# Patient Record
Sex: Male | Born: 1950 | Race: White | Hispanic: No | Marital: Married | Smoking: Light tobacco smoker
Health system: Southern US, Community
[De-identification: ages and names within clinical notes are randomized; demographics above are authoritative.]

## PROBLEM LIST (undated history)

## (undated) DIAGNOSIS — F419 Anxiety disorder, unspecified: Secondary | ICD-10-CM

## (undated) DIAGNOSIS — I10 Essential (primary) hypertension: Secondary | ICD-10-CM

## (undated) HISTORY — PX: CATARACT EXTRACTION: SUR2

---

## 2005-05-18 ENCOUNTER — Ambulatory Visit (HOSPITAL_COMMUNITY): Admission: RE | Admit: 2005-05-18 | Discharge: 2005-05-18 | Payer: Self-pay | Admitting: Pulmonary Disease

## 2009-08-03 ENCOUNTER — Ambulatory Visit: Admission: RE | Admit: 2009-08-03 | Discharge: 2009-08-03 | Payer: Self-pay | Admitting: Pulmonary Disease

## 2013-09-16 NOTE — H&P (Signed)
Christopher Lowery is an 63 y.o. male.   Chief Complaint: Need for screening colonoscopy HPI: 64 year old white male who presents for a screening colonoscopy. His last colonoscopy was over 10 years ago.  No past medical history on file.  No past surgical history on file.  No family history on file. Social History:  has no tobacco, alcohol, and drug history on file.  Allergies: Allergies not on file  No prescriptions prior to admission    No results found for this or any previous visit (from the past 48 hour(s)). No results found.  Review of Systems  All other systems reviewed and are negative.   There were no vitals taken for this visit. Physical Exam  Constitutional: He is oriented to person, place, and time. He appears well-developed and well-nourished.  HENT:  Head: Normocephalic and atraumatic.  Neck: Normal range of motion. Neck supple.  Cardiovascular: Normal rate, regular rhythm and normal heart sounds.   Respiratory: Effort normal and breath sounds normal.  GI: Soft. Bowel sounds are normal.  Neurological: He is alert and oriented to person, place, and time.  Skin: Skin is warm and dry.     Assessment/Plan Impression: Need for screening colonoscopy Plan: Patient will undergo a colonoscopy on 09/18/2013. Risks and benefits of the procedure including bleeding and perforation were fully explained to the patient, who gave informed consent.  Bentli Llorente A 09/16/2013, 11:17 AM

## 2013-09-17 ENCOUNTER — Encounter (HOSPITAL_COMMUNITY): Payer: Self-pay | Admitting: Pharmacy Technician

## 2013-09-18 ENCOUNTER — Ambulatory Visit (HOSPITAL_COMMUNITY)
Admission: RE | Admit: 2013-09-18 | Discharge: 2013-09-18 | Disposition: A | Discharge status: Home / Self Care | Payer: BC Managed Care – PPO | Source: Ambulatory Visit | Attending: General Surgery | Admitting: General Surgery

## 2013-09-18 ENCOUNTER — Encounter (HOSPITAL_COMMUNITY): Payer: Self-pay | Admitting: *Deleted

## 2013-09-18 ENCOUNTER — Encounter (HOSPITAL_COMMUNITY)
Admission: RE | Disposition: A | Discharge status: Home / Self Care | Payer: Self-pay | Source: Ambulatory Visit | Attending: General Surgery

## 2013-09-18 DIAGNOSIS — Z1211 Encounter for screening for malignant neoplasm of colon: Secondary | ICD-10-CM | POA: Diagnosis present

## 2013-09-18 HISTORY — PX: COLONOSCOPY: SHX5424

## 2013-09-18 HISTORY — DX: Essential (primary) hypertension: I10

## 2013-09-18 HISTORY — DX: Anxiety disorder, unspecified: F41.9

## 2013-09-18 SURGERY — COLONOSCOPY
Anesthesia: Moderate Sedation

## 2013-09-18 MED ORDER — MEPERIDINE HCL 50 MG/ML IJ SOLN
INTRAMUSCULAR | Status: DC | PRN
  Administered 2013-09-18: 50 mg via INTRAVENOUS

## 2013-09-18 MED ORDER — MEPERIDINE HCL 50 MG/ML IJ SOLN
INTRAMUSCULAR | Status: AC
  Filled 2013-09-18: qty 1

## 2013-09-18 MED ORDER — MIDAZOLAM HCL 5 MG/5ML IJ SOLN
INTRAMUSCULAR | Status: DC | PRN
  Administered 2013-09-18: 1 mg via INTRAVENOUS
  Administered 2013-09-18: 3 mg via INTRAVENOUS

## 2013-09-18 MED ORDER — STERILE WATER FOR IRRIGATION IR SOLN
Status: DC | PRN
  Administered 2013-09-18: 12:00:00

## 2013-09-18 MED ORDER — MIDAZOLAM HCL 5 MG/5ML IJ SOLN
INTRAMUSCULAR | Status: AC
  Filled 2013-09-18: qty 5

## 2013-09-18 MED ORDER — SODIUM CHLORIDE 0.9 % IV SOLN
INTRAVENOUS | Status: DC
  Administered 2013-09-18: 12:00:00 via INTRAVENOUS

## 2013-09-18 NOTE — Op Note (Signed)
[place], [address]   COLONOSCOPY PROCEDURE REPORT  PATIENT: Christopher, Lowery  MR#: [id number] BIRTHDATE: February 24, 1950 , 63  yrs. old GENDER: Male ENDOSCOPIST: Aviva Signs, MD REFERRED LY:YTKPTWS, Edward PROCEDURE DATE:  09/18/2013 PROCEDURE:   Colonoscopy, screening ASA CLASS:   Class II INDICATIONS:Average risk patient for colon cancer. MEDICATIONS: Versed 4 mg IV and Demerol 50 mg IV  DESCRIPTION OF PROCEDURE:   After the risks benefits and alternatives of the procedure were thoroughly explained, informed consent was obtained.  A digital rectal exam revealed no abnormalities of the rectum.   The EC-[id number] ([id number])  endoscope was introduced through the anus and advanced to the cecum, which was identified by both the appendix and ileocecal valve. No adverse events experienced.   The quality of the prep was good, using Trilyte  The instrument was then slowly withdrawn as the colon was fully examined.      COLON FINDINGS: A normal appearing cecum, ileocecal valve, and appendiceal orifice were identified.  The ascending, hepatic flexure, transverse, splenic flexure, descending, sigmoid colon and rectum appeared unremarkable.  No polyps or cancers were seen. Retroflexed views revealed no abnormalities. The time to cecum=4 minutes 0 seconds.  Withdrawal time=4 minutes 0 seconds.  The scope was withdrawn and the procedure completed. COMPLICATIONS: There were no complications.  ENDOSCOPIC IMPRESSION: Normal colon  RECOMMENDATIONS: Repeat Colonscopy in 10 years.   eSigned:  Aviva Signs, MD 09/18/2013 12:41 PM   cc:

## 2013-09-18 NOTE — Interval H&P Note (Signed)
History and Physical Interval Note:  09/18/2013 12:04 PM  Christopher Lowery  has presented today for surgery, with the diagnosis of screening  The various methods of treatment have been discussed with the patient and family. After consideration of risks, benefits and other options for treatment, the patient has consented to  Procedure(s): COLONOSCOPY (N/A) as a surgical intervention .  The patient's history has been reviewed, patient examined, no change in status, stable for surgery.  I have reviewed the patient's chart and labs.  Questions were answered to the patient's satisfaction.     Aviva Signs A

## 2013-09-18 NOTE — Discharge Instructions (Signed)

## 2013-09-23 ENCOUNTER — Encounter (HOSPITAL_COMMUNITY): Payer: Self-pay | Admitting: General Surgery

## 2015-06-06 ENCOUNTER — Ambulatory Visit (INDEPENDENT_AMBULATORY_CARE_PROVIDER_SITE_OTHER): Payer: Self-pay

## 2015-06-06 ENCOUNTER — Ambulatory Visit (INDEPENDENT_AMBULATORY_CARE_PROVIDER_SITE_OTHER): Payer: Self-pay | Admitting: Orthopedic Surgery

## 2015-06-06 VITALS — BP 141/81 | Ht 68.0 in | Wt 245.0 lb

## 2015-06-06 DIAGNOSIS — M25531 Pain in right wrist: Secondary | ICD-10-CM

## 2015-06-06 DIAGNOSIS — M654 Radial styloid tenosynovitis [de Quervain]: Secondary | ICD-10-CM

## 2015-06-06 NOTE — Patient Instructions (Signed)
Splint x 4 weeks  Ice at night and once again in the day if possible  aspercreme morning and night     De Quervain Tenosynovitis Tendons attach muscles to bones. They also help with joint movements. When tendons become irritated or swollen, it is called tendinitis. The extensor pollicis brevis (EPB) tendon connects the EPB muscle to a bone that is near the base of the thumb. The EPB muscle helps to straighten and extend the thumb. De Quervain tenosynovitis is a condition in which the EPB tendon lining (sheath) becomes irritated, thickened, and swollen. This condition is sometimes called stenosing tenosynovitis. This condition causes pain on the thumb side of the back of the wrist. CAUSES Causes of this condition include:  Activities that repeatedly cause your thumb and wrist to extend.  A sudden increase in activity or change in activity that affects your wrist. RISK FACTORS: This condition is more likely to develop in:  Females.  People who have diabetes.  Women who have recently given birth.  People who are over 69 years of age.  People who do activities that involve repeated hand and wrist motions, such as tennis, racquetball, volleyball, gardening, and taking care of children.  People who do heavy labor.  People who have poor wrist strength and flexibility.  People who do not warm up properly before activities. SYMPTOMS Symptoms of this condition include:  Pain or tenderness over the thumb side of the back of the wrist when your thumb and wrist are not moving.  Pain that gets worse when you straighten your thumb or extend your thumb or wrist.  Pain when the injured area is touched.  Locking or catching of the thumb joint while you bend and straighten your thumb.  Decreased thumb motion due to pain.  Swelling over the affected area. DIAGNOSIS This condition is diagnosed with a medical history and physical exam. Your health care provider will ask for details about  your injury and ask about your symptoms. TREATMENT Treatment may include the use of icing and medicines to reduce pain and swelling. You may also be advised to wear a splint or brace to limit your thumb and wrist motion. In less severe cases, treatment may also include working with a physical therapist to strengthen your wrist and calm the irritation around your EPB tendon sheath. In severe cases, surgery may be needed. HOME CARE INSTRUCTIONS If You Have a Splint or Brace:  Wear it as told by your health care provider. Remove it only as told by your health care provider.  Loosen the splint or brace if your fingers become numb and tingle, or if they turn cold and blue.  Keep the splint or brace clean and dry. Managing Pain, Stiffness, and Swelling   If directed, apply ice to the injured area.  Put ice in a plastic bag.  Place a towel between your skin and the bag.  Leave the ice on for 20 minutes, 2-3 times per day.  Move your fingers often to avoid stiffness and to lessen swelling.  Raise (elevate) the injured area above the level of your heart while you are sitting or lying down. General Instructions  Return to your normal activities as told by your health care provider. Ask your health care provider what activities are safe for you.  Take over-the-counter and prescription medicines only as told by your health care provider.  Keep all follow-up visits as told by your health care provider. This is important.  Do not drive or  operate heavy machinery while taking prescription pain medicine. SEEK MEDICAL CARE IF:  Your pain, tenderness, or swelling gets worse, even if you have had treatment.  You have numbness or tingling in your wrist, hand, or fingers on the injured side.   This information is not intended to replace advice given to you by your health care provider. Make sure you discuss any questions you have with your health care provider.   Document Released: 01/22/2005  Document Revised: 10/13/2014 Document Reviewed: 03/30/2014 Elsevier Interactive Patient Education [employer].

## 2015-06-08 NOTE — Progress Notes (Signed)
Chief Complaint  Patient presents with  . Wrist Pain    Right wrist pain, no injury.   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 No trauma. He reports a dull aching pain associated with some mild swelling over the first extensor compartment of the right hand. He says the pain intermittently increases and decreases it does not appear to be affecting his work but maybe his golf game. He denies any trauma  ROS related review of systems  Skin no changes in the area noted. No numbness or tingling in that area.  Past Medical History  Diagnosis Date  . Hypertension   . Anxiety     Past Surgical History  Procedure Laterality Date  . Colonoscopy N/A 09/18/2013    Procedure: COLONOSCOPY;  Surgeon: Jamesetta So, MD;  Location: [facility];  Service: Gastroenterology;  Laterality: N/A;    Social History  Substance Use Topics  . Smoking status: Light Tobacco Smoker -- 10 years    Types: Cigars  . Smokeless tobacco: Not on file  . Alcohol Use: 2.0 oz/week    4 drink(s) per week    Current outpatient prescriptions:  .  amLODipine-olmesartan (AZOR) 5-20 MG per tablet, Take 1 tablet by mouth daily., Disp: , Rfl:  .  doxycycline (VIBRA-TABS) 100 MG tablet, Take 100 mg by mouth 2 (two) times daily., Disp: , Rfl:  .  propranolol ER (INDERAL LA) 60 MG 24 hr capsule, Take 60 mg by mouth daily., Disp: , Rfl:  .  sertraline (ZOLOFT) 100 MG tablet, Take 150 mg by mouth daily., Disp: , Rfl:  .  vitamin E 200 UNIT capsule, Take 200 Units by mouth daily., Disp: , Rfl:  .  zolpidem (AMBIEN) 10 MG tablet, Take 5 mg by mouth at bedtime as needed for sleep., Disp: , Rfl:   BP 141/81 mmHg  Ht 5\' 8"  (1.727 m)  Wt 245 lb (111.131 kg)  BMI 37.26 kg/m2  Physical Exam  Constitutional: He is oriented to person, place, and time. He appears well-developed and well-nourished. No distress.  Cardiovascular: Normal rate and intact distal pulses.   Neurological: He is  alert and oriented to person, place, and time.  Skin: Skin is warm and dry. No rash noted. He is not diaphoretic. No erythema. No pallor.  Psychiatric: He has a normal mood and affect. His behavior is normal. Judgment and thought content normal.    Ortho Exam Right wrist tender over the first extensor compartment positive Finkelstein's test otherwise range of motion is normal there is no instability of the wrist is normal strength muscle tone skin is normal pulses are good capillary refill is normal epitrochlear lymph nodes are normal and sensation is normal  ASSESSMENT: My personal interpretation of the images:  I ordered an x-ray of his hand and that x-ray by my interpretation shows no arthritis of the [facility] joint    PLAN Recommend splinting and topical Aspercreme for 4-6 weeks if no improvement patient requested injection

## 2016-04-20 DIAGNOSIS — M109 Gout, unspecified: Secondary | ICD-10-CM | POA: Diagnosis not present

## 2016-04-20 DIAGNOSIS — R739 Hyperglycemia, unspecified: Secondary | ICD-10-CM | POA: Diagnosis not present

## 2016-04-20 DIAGNOSIS — I10 Essential (primary) hypertension: Secondary | ICD-10-CM | POA: Diagnosis not present

## 2016-04-23 DIAGNOSIS — M546 Pain in thoracic spine: Secondary | ICD-10-CM | POA: Diagnosis not present

## 2016-04-23 DIAGNOSIS — M9902 Segmental and somatic dysfunction of thoracic region: Secondary | ICD-10-CM | POA: Diagnosis not present

## 2016-04-23 DIAGNOSIS — M542 Cervicalgia: Secondary | ICD-10-CM | POA: Diagnosis not present

## 2016-04-23 DIAGNOSIS — M9901 Segmental and somatic dysfunction of cervical region: Secondary | ICD-10-CM | POA: Diagnosis not present

## 2016-04-26 DIAGNOSIS — M109 Gout, unspecified: Secondary | ICD-10-CM | POA: Diagnosis not present

## 2016-04-26 DIAGNOSIS — I10 Essential (primary) hypertension: Secondary | ICD-10-CM | POA: Diagnosis not present

## 2016-04-26 DIAGNOSIS — Z79899 Other long term (current) drug therapy: Secondary | ICD-10-CM | POA: Diagnosis not present

## 2016-05-10 DIAGNOSIS — M9902 Segmental and somatic dysfunction of thoracic region: Secondary | ICD-10-CM | POA: Diagnosis not present

## 2016-05-10 DIAGNOSIS — M542 Cervicalgia: Secondary | ICD-10-CM | POA: Diagnosis not present

## 2016-05-10 DIAGNOSIS — M9901 Segmental and somatic dysfunction of cervical region: Secondary | ICD-10-CM | POA: Diagnosis not present

## 2016-05-10 DIAGNOSIS — M546 Pain in thoracic spine: Secondary | ICD-10-CM | POA: Diagnosis not present

## 2016-05-17 DIAGNOSIS — M9902 Segmental and somatic dysfunction of thoracic region: Secondary | ICD-10-CM | POA: Diagnosis not present

## 2016-05-17 DIAGNOSIS — M542 Cervicalgia: Secondary | ICD-10-CM | POA: Diagnosis not present

## 2016-05-17 DIAGNOSIS — M9901 Segmental and somatic dysfunction of cervical region: Secondary | ICD-10-CM | POA: Diagnosis not present

## 2016-05-17 DIAGNOSIS — M546 Pain in thoracic spine: Secondary | ICD-10-CM | POA: Diagnosis not present

## 2016-05-22 DIAGNOSIS — M9902 Segmental and somatic dysfunction of thoracic region: Secondary | ICD-10-CM | POA: Diagnosis not present

## 2016-05-22 DIAGNOSIS — M9901 Segmental and somatic dysfunction of cervical region: Secondary | ICD-10-CM | POA: Diagnosis not present

## 2016-05-22 DIAGNOSIS — M542 Cervicalgia: Secondary | ICD-10-CM | POA: Diagnosis not present

## 2016-05-22 DIAGNOSIS — M546 Pain in thoracic spine: Secondary | ICD-10-CM | POA: Diagnosis not present

## 2016-05-24 DIAGNOSIS — M9901 Segmental and somatic dysfunction of cervical region: Secondary | ICD-10-CM | POA: Diagnosis not present

## 2016-05-24 DIAGNOSIS — M546 Pain in thoracic spine: Secondary | ICD-10-CM | POA: Diagnosis not present

## 2016-05-24 DIAGNOSIS — M9902 Segmental and somatic dysfunction of thoracic region: Secondary | ICD-10-CM | POA: Diagnosis not present

## 2016-05-24 DIAGNOSIS — M542 Cervicalgia: Secondary | ICD-10-CM | POA: Diagnosis not present

## 2016-05-29 DIAGNOSIS — M9901 Segmental and somatic dysfunction of cervical region: Secondary | ICD-10-CM | POA: Diagnosis not present

## 2016-05-29 DIAGNOSIS — M542 Cervicalgia: Secondary | ICD-10-CM | POA: Diagnosis not present

## 2016-05-29 DIAGNOSIS — M9902 Segmental and somatic dysfunction of thoracic region: Secondary | ICD-10-CM | POA: Diagnosis not present

## 2016-05-29 DIAGNOSIS — M546 Pain in thoracic spine: Secondary | ICD-10-CM | POA: Diagnosis not present

## 2016-06-05 DIAGNOSIS — M546 Pain in thoracic spine: Secondary | ICD-10-CM | POA: Diagnosis not present

## 2016-06-05 DIAGNOSIS — M542 Cervicalgia: Secondary | ICD-10-CM | POA: Diagnosis not present

## 2016-06-05 DIAGNOSIS — M9901 Segmental and somatic dysfunction of cervical region: Secondary | ICD-10-CM | POA: Diagnosis not present

## 2016-06-05 DIAGNOSIS — M9902 Segmental and somatic dysfunction of thoracic region: Secondary | ICD-10-CM | POA: Diagnosis not present

## 2016-06-28 DIAGNOSIS — M546 Pain in thoracic spine: Secondary | ICD-10-CM | POA: Diagnosis not present

## 2016-06-28 DIAGNOSIS — M9902 Segmental and somatic dysfunction of thoracic region: Secondary | ICD-10-CM | POA: Diagnosis not present

## 2016-06-28 DIAGNOSIS — M9901 Segmental and somatic dysfunction of cervical region: Secondary | ICD-10-CM | POA: Diagnosis not present

## 2016-06-28 DIAGNOSIS — M542 Cervicalgia: Secondary | ICD-10-CM | POA: Diagnosis not present

## 2016-07-11 DIAGNOSIS — M546 Pain in thoracic spine: Secondary | ICD-10-CM | POA: Diagnosis not present

## 2016-07-11 DIAGNOSIS — M542 Cervicalgia: Secondary | ICD-10-CM | POA: Diagnosis not present

## 2016-07-11 DIAGNOSIS — M9901 Segmental and somatic dysfunction of cervical region: Secondary | ICD-10-CM | POA: Diagnosis not present

## 2016-07-11 DIAGNOSIS — M9902 Segmental and somatic dysfunction of thoracic region: Secondary | ICD-10-CM | POA: Diagnosis not present

## 2016-07-12 DIAGNOSIS — I10 Essential (primary) hypertension: Secondary | ICD-10-CM | POA: Diagnosis not present

## 2016-07-12 DIAGNOSIS — M542 Cervicalgia: Secondary | ICD-10-CM | POA: Diagnosis not present

## 2016-07-12 DIAGNOSIS — G4700 Insomnia, unspecified: Secondary | ICD-10-CM | POA: Diagnosis not present

## 2016-07-12 DIAGNOSIS — R251 Tremor, unspecified: Secondary | ICD-10-CM | POA: Diagnosis not present

## 2016-07-13 DIAGNOSIS — M542 Cervicalgia: Secondary | ICD-10-CM | POA: Diagnosis not present

## 2016-07-13 DIAGNOSIS — M9902 Segmental and somatic dysfunction of thoracic region: Secondary | ICD-10-CM | POA: Diagnosis not present

## 2016-07-13 DIAGNOSIS — M9901 Segmental and somatic dysfunction of cervical region: Secondary | ICD-10-CM | POA: Diagnosis not present

## 2016-07-13 DIAGNOSIS — M546 Pain in thoracic spine: Secondary | ICD-10-CM | POA: Diagnosis not present

## 2016-07-16 ENCOUNTER — Other Ambulatory Visit (HOSPITAL_COMMUNITY): Payer: Self-pay | Admitting: Pulmonary Disease

## 2016-07-16 DIAGNOSIS — M542 Cervicalgia: Secondary | ICD-10-CM

## 2016-07-19 ENCOUNTER — Ambulatory Visit (HOSPITAL_COMMUNITY): Payer: Medicare HMO

## 2016-07-19 ENCOUNTER — Encounter (HOSPITAL_COMMUNITY): Payer: Self-pay

## 2016-07-20 ENCOUNTER — Ambulatory Visit (HOSPITAL_COMMUNITY)
Admission: RE | Admit: 2016-07-20 | Discharge: 2016-07-20 | Disposition: A | Discharge status: Home / Self Care | Payer: Medicare HMO | Source: Ambulatory Visit | Attending: Pulmonary Disease | Admitting: Pulmonary Disease

## 2016-07-20 DIAGNOSIS — M542 Cervicalgia: Secondary | ICD-10-CM | POA: Insufficient documentation

## 2016-07-20 DIAGNOSIS — M4802 Spinal stenosis, cervical region: Secondary | ICD-10-CM | POA: Insufficient documentation

## 2016-07-24 ENCOUNTER — Other Ambulatory Visit: Payer: Self-pay | Admitting: Pulmonary Disease

## 2016-07-24 DIAGNOSIS — M542 Cervicalgia: Secondary | ICD-10-CM

## 2016-07-25 ENCOUNTER — Ambulatory Visit (HOSPITAL_COMMUNITY): Payer: Medicare HMO

## 2016-08-03 ENCOUNTER — Ambulatory Visit
Admission: RE | Admit: 2016-08-03 | Discharge: 2016-08-03 | Disposition: A | Discharge status: Home / Self Care | Payer: Medicare HMO | Source: Ambulatory Visit | Attending: Pulmonary Disease | Admitting: Pulmonary Disease

## 2016-08-03 DIAGNOSIS — M542 Cervicalgia: Secondary | ICD-10-CM

## 2016-08-03 DIAGNOSIS — M4712 Other spondylosis with myelopathy, cervical region: Secondary | ICD-10-CM | POA: Diagnosis not present

## 2016-08-03 MED ORDER — IOPAMIDOL (ISOVUE-M 300) INJECTION 61%
1.0000 mL | Freq: Once | INTRAMUSCULAR | Status: AC | PRN
  Administered 2016-08-03: 1 mL via EPIDURAL

## 2016-08-03 MED ORDER — TRIAMCINOLONE ACETONIDE 40 MG/ML IJ SUSP (RADIOLOGY)
60.0000 mg | Freq: Once | INTRAMUSCULAR | Status: AC
  Administered 2016-08-03: 60 mg via EPIDURAL

## 2016-08-03 NOTE — Discharge Instructions (Signed)

## 2016-08-20 ENCOUNTER — Other Ambulatory Visit: Payer: Self-pay | Admitting: Pulmonary Disease

## 2016-08-20 DIAGNOSIS — M542 Cervicalgia: Secondary | ICD-10-CM

## 2016-09-28 DIAGNOSIS — G4733 Obstructive sleep apnea (adult) (pediatric): Secondary | ICD-10-CM | POA: Diagnosis not present

## 2016-10-19 DIAGNOSIS — M9901 Segmental and somatic dysfunction of cervical region: Secondary | ICD-10-CM | POA: Diagnosis not present

## 2016-10-19 DIAGNOSIS — M9902 Segmental and somatic dysfunction of thoracic region: Secondary | ICD-10-CM | POA: Diagnosis not present

## 2016-10-19 DIAGNOSIS — M546 Pain in thoracic spine: Secondary | ICD-10-CM | POA: Diagnosis not present

## 2016-10-19 DIAGNOSIS — M542 Cervicalgia: Secondary | ICD-10-CM | POA: Diagnosis not present

## 2016-11-02 ENCOUNTER — Ambulatory Visit
Admission: RE | Admit: 2016-11-02 | Discharge: 2016-11-02 | Disposition: A | Discharge status: Home / Self Care | Payer: Medicare HMO | Source: Ambulatory Visit | Attending: Pulmonary Disease | Admitting: Pulmonary Disease

## 2016-11-02 DIAGNOSIS — M542 Cervicalgia: Secondary | ICD-10-CM

## 2016-11-02 DIAGNOSIS — M47812 Spondylosis without myelopathy or radiculopathy, cervical region: Secondary | ICD-10-CM | POA: Diagnosis not present

## 2016-11-02 MED ORDER — IOPAMIDOL (ISOVUE-M 300) INJECTION 61%
1.0000 mL | Freq: Once | INTRAMUSCULAR | Status: AC | PRN
  Administered 2016-11-02: 1 mL via EPIDURAL

## 2016-11-02 MED ORDER — TRIAMCINOLONE ACETONIDE 40 MG/ML IJ SUSP (RADIOLOGY)
60.0000 mg | Freq: Once | INTRAMUSCULAR | Status: AC
  Administered 2016-11-02: 60 mg via EPIDURAL

## 2016-11-02 NOTE — Discharge Instructions (Signed)

## 2018-03-29 DIAGNOSIS — M542 Cervicalgia: Secondary | ICD-10-CM | POA: Diagnosis not present

## 2018-03-29 DIAGNOSIS — Z125 Encounter for screening for malignant neoplasm of prostate: Secondary | ICD-10-CM | POA: Diagnosis not present

## 2018-03-29 DIAGNOSIS — R739 Hyperglycemia, unspecified: Secondary | ICD-10-CM | POA: Diagnosis not present

## 2018-03-29 DIAGNOSIS — R251 Tremor, unspecified: Secondary | ICD-10-CM | POA: Diagnosis not present

## 2018-03-29 DIAGNOSIS — G4700 Insomnia, unspecified: Secondary | ICD-10-CM | POA: Diagnosis not present

## 2018-03-29 DIAGNOSIS — Z0000 Encounter for general adult medical examination without abnormal findings: Secondary | ICD-10-CM | POA: Diagnosis not present

## 2018-03-29 DIAGNOSIS — M109 Gout, unspecified: Secondary | ICD-10-CM | POA: Diagnosis not present

## 2018-03-29 DIAGNOSIS — I10 Essential (primary) hypertension: Secondary | ICD-10-CM | POA: Diagnosis not present

## 2018-04-04 DIAGNOSIS — Z0000 Encounter for general adult medical examination without abnormal findings: Secondary | ICD-10-CM | POA: Diagnosis not present

## 2018-04-04 DIAGNOSIS — G4733 Obstructive sleep apnea (adult) (pediatric): Secondary | ICD-10-CM | POA: Diagnosis not present

## 2018-05-04 DIAGNOSIS — Z1212 Encounter for screening for malignant neoplasm of rectum: Secondary | ICD-10-CM | POA: Diagnosis not present

## 2018-05-04 DIAGNOSIS — Z1211 Encounter for screening for malignant neoplasm of colon: Secondary | ICD-10-CM | POA: Diagnosis not present

## 2018-07-17 ENCOUNTER — Telehealth: Payer: Self-pay

## 2018-07-17 ENCOUNTER — Other Ambulatory Visit: Payer: Medicare HMO

## 2018-07-17 DIAGNOSIS — Z20822 Contact with and (suspected) exposure to covid-19: Secondary | ICD-10-CM

## 2018-07-17 DIAGNOSIS — R6889 Other general symptoms and signs: Secondary | ICD-10-CM | POA: Diagnosis not present

## 2018-07-17 NOTE — Telephone Encounter (Signed)
Incoming call from Dr. Luan Pulling requesting that Patient be tested for Covid-19

## 2018-07-22 LAB — NOVEL CORONAVIRUS, NAA: SARS-CoV-2, NAA: NOT DETECTED

## 2018-09-05 DIAGNOSIS — M1040 Other secondary gout, unspecified site: Secondary | ICD-10-CM | POA: Diagnosis not present

## 2018-09-05 DIAGNOSIS — G4700 Insomnia, unspecified: Secondary | ICD-10-CM | POA: Diagnosis not present

## 2018-09-05 DIAGNOSIS — R251 Tremor, unspecified: Secondary | ICD-10-CM | POA: Diagnosis not present

## 2018-09-05 DIAGNOSIS — I10 Essential (primary) hypertension: Secondary | ICD-10-CM | POA: Diagnosis not present

## 2018-10-05 IMAGING — XA DG INJECT/[PERSON_NAME] INC NEEDLE/CATH/PLC EPI/CERV/THOR W/IMG
1 series · 1 of 1 positions shown · non-contrast
Comparison: none

CLINICAL DATA: Cervical spondylosis without myelopathy. Excellent
relief from previous injection, now with RIGHT-sided symptoms.

[Series 1: ortho standard · 1 of 1 slices shown]
[im 1/1]
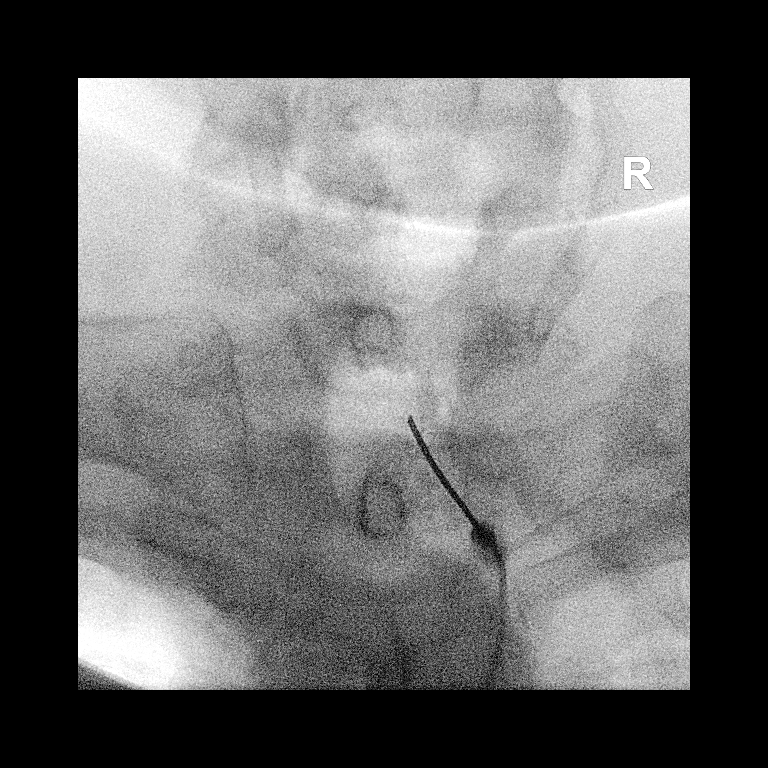

[1 of 1 positions shown; findings below may reference images not displayed]

FLUOROSCOPY TIME:  17 seconds corresponding to a Dose Area Product
of 15.84 ?Gy*m2

PROCEDURE:
Informed written consent was obtained.  Time-out was performed.

An appropriate skin entry site was chosen, cleansed with Betadine,
and anesthetized with 1% lidocaine.

CERVICAL EPIDURAL INJECTION

An interlaminar approach was performed on the RIGHT at C7-T1 . A 20
gauge epidural needle was advanced using loss-of-resistance
technique.

DIAGNOSTIC EPIDURAL INJECTION

Injection of Isovue-M 300 shows a good epidural pattern with spread
above and below the level of needle placement, primarily on the
RIGHT. No vascular opacification is seen.

THERAPEUTIC EPIDURAL INJECTION

1.5 ml of Kenalog 40 mixed with 1 ml of 1% Lidocaine and 2 ml of
normal saline were then instilled. The procedure was well-tolerated,
and the patient was discharged thirty minutes following the
injection in good condition.
IMPRESSION: Technically successful first epidural injection on the RIGHT at
C7-T1.

## 2019-03-02 ENCOUNTER — Ambulatory Visit: Payer: Medicare HMO | Attending: Internal Medicine

## 2019-03-02 ENCOUNTER — Other Ambulatory Visit: Payer: Self-pay

## 2019-03-02 DIAGNOSIS — Z20822 Contact with and (suspected) exposure to covid-19: Secondary | ICD-10-CM

## 2019-03-03 ENCOUNTER — Other Ambulatory Visit: Payer: Medicare HMO

## 2019-03-03 LAB — NOVEL CORONAVIRUS, NAA: SARS-CoV-2, NAA: NOT DETECTED

## 2019-10-27 ENCOUNTER — Other Ambulatory Visit: Payer: Self-pay

## 2019-10-27 ENCOUNTER — Other Ambulatory Visit: Payer: Medicare HMO

## 2019-10-27 DIAGNOSIS — Z20822 Contact with and (suspected) exposure to covid-19: Secondary | ICD-10-CM

## 2019-10-29 LAB — SPECIMEN STATUS REPORT

## 2019-10-29 LAB — SARS-COV-2, NAA 2 DAY TAT

## 2019-10-29 LAB — NOVEL CORONAVIRUS, NAA: SARS-CoV-2, NAA: NOT DETECTED

## 2020-03-18 DIAGNOSIS — G4733 Obstructive sleep apnea (adult) (pediatric): Secondary | ICD-10-CM | POA: Diagnosis not present

## 2020-10-13 DIAGNOSIS — Z6837 Body mass index (BMI) 37.0-37.9, adult: Secondary | ICD-10-CM | POA: Diagnosis not present

## 2020-10-13 DIAGNOSIS — G252 Other specified forms of tremor: Secondary | ICD-10-CM | POA: Diagnosis not present

## 2020-10-13 DIAGNOSIS — G250 Essential tremor: Secondary | ICD-10-CM | POA: Diagnosis not present

## 2020-10-13 DIAGNOSIS — E559 Vitamin D deficiency, unspecified: Secondary | ICD-10-CM | POA: Diagnosis not present

## 2020-10-13 DIAGNOSIS — I10 Essential (primary) hypertension: Secondary | ICD-10-CM | POA: Diagnosis not present

## 2020-10-13 DIAGNOSIS — E782 Mixed hyperlipidemia: Secondary | ICD-10-CM | POA: Diagnosis not present

## 2020-10-13 DIAGNOSIS — E7849 Other hyperlipidemia: Secondary | ICD-10-CM | POA: Diagnosis not present

## 2020-10-13 DIAGNOSIS — M109 Gout, unspecified: Secondary | ICD-10-CM | POA: Diagnosis not present

## 2020-10-13 DIAGNOSIS — E538 Deficiency of other specified B group vitamins: Secondary | ICD-10-CM | POA: Diagnosis not present

## 2020-10-20 ENCOUNTER — Ambulatory Visit: Payer: Medicare HMO | Admitting: Neurology

## 2020-10-20 ENCOUNTER — Other Ambulatory Visit: Payer: Self-pay

## 2020-10-20 ENCOUNTER — Encounter: Payer: Self-pay | Admitting: Neurology

## 2020-10-20 VITALS — BP 128/79 | HR 51 | Ht 68.0 in | Wt 249.0 lb

## 2020-10-20 DIAGNOSIS — G3184 Mild cognitive impairment, so stated: Secondary | ICD-10-CM

## 2020-10-20 DIAGNOSIS — G250 Essential tremor: Secondary | ICD-10-CM | POA: Diagnosis not present

## 2020-10-20 MED ORDER — PROPRANOLOL HCL 10 MG PO TABS: 20.0000 mg | ORAL_TABLET | Freq: Three times a day (TID) | ORAL | 11 refills | Status: DC

## 2020-10-20 NOTE — Patient Instructions (Signed)
Laboratory test.  CMP CBC TSH B12 RPR

## 2020-10-20 NOTE — Progress Notes (Signed)
Chief Complaint  Patient presents with   New Patient (Initial Visit)    Tremors , new rm, alone, c/o of tremors in both hands, worse when he is nervous, states propranolol is helpful       ASSESSMENT AND PLAN  Christopher Lowery is a 70 y.o. male  Mild cognitive impairment  Family history of dementia  MRI of the brain without contrast  Laboratory evaluation by his primary care physician  Emphasize importance of moderate exercise  Essential tremor  Family history of essential tremor, symmetric, action tremor,  Has been treated with Inderal LA 60 mg for more than 20 years, sinus bradycardia, rate around 50,  After discussed with patient, decided to stop Inderal LA, instead taking regular preparation of Inderal 10 mg up to 2 tablets 3 times a day as needed   DIAGNOSTIC DATA (LABS, IMAGING, TESTING) - I reviewed patient records, labs, notes, testing and imaging myself where available.   MEDICAL HISTORY:  Christopher SCIARRETTA, is a 70 year old male, seen in request by his primary care physician Dr. Hilma Favors, Jenny Reichmann, for evaluation of essential tremor, concerning of mild memory loss, initial evaluation was on October 20, 2020  I reviewed and summarized the referring note. PMHX Depression, HTN Obesity.  He is a Careers information officer at [employer], family history of dementia, his father suffered dementia, around age 23s, died at 12, mother has essential tremor,  He noticed mild bilateral hand shaking around 2000, along with his blood pressure, he started taking Inderal LA 60 mg daily, blood pressure is well controlled, along with amlodipine and olmesartan, his heart rate is sinus bradycardia 50s  Around 2020, he noticed mild memory loss, difficulty came up with names, gradually getting worse  Since 2022, he also began to notice increased bilateral hands tremor, especially when he was doing delicate procedures, began to take nderal immediate release, which has been helpful  some,   PHYSICAL EXAM:   Vitals:   10/20/20 1538  BP: 128/79  Pulse: (!) 51  Weight: 249 lb (112.9 kg)  Height: '5\' 8"'$  (1.727 m)   Not recorded     Body mass index is 37.86 kg/m.  PHYSICAL EXAMNIATION:  Gen: NAD, conversant, well nourised, well groomed                     Cardiovascular: Regular rate rhythm, no peripheral edema, warm, nontender. Eyes: Conjunctivae clear without exudates or hemorrhage Neck: Supple, no carotid bruits. Pulmonary: Clear to auscultation bilaterally   NEUROLOGICAL EXAM:  MENTAL STATUS: Speech:    Speech is normal; fluent and spontaneous with normal comprehension.  Cognition:    Montreal Cognitive Assessment  10/20/2020  Visuospatial/ Executive (0/5) 3  Naming (0/3) 3  Attention: Read list of digits (0/2) 1  Attention: Read list of letters (0/1) 1  Attention: Serial 7 subtraction starting at 100 (0/3) 2  Language: Repeat phrase (0/2) 2  Language : Fluency (0/1) 0  Abstraction (0/2) 2  Delayed Recall (0/5) 2  Orientation (0/6) 6  Total 22      CRANIAL NERVES: CN II: Visual fields are full to confrontation. Pupils are round equal and briskly reactive to light. CN III, IV, VI: extraocular movement are normal. No ptosis. CN V: Facial sensation is intact to light touch CN VII: Face is symmetric with normal eye closure  CN VIII: Hearing is normal to causal conversation. CN IX, X: Phonation is normal. CN XI: Head turning and shoulder shrug are intact  MOTOR: There  is no pronator drift of out-stretched arms. Muscle bulk and tone are normal. Muscle strength is normal.  REFLEXES: Reflexes are 2+ and symmetric at the biceps, triceps, knees, and ankles. Plantar responses are flexor.  SENSORY: Intact to light touch, pinprick and vibratory sensation are intact in fingers and toes.  COORDINATION: There is no trunk or limb dysmetria noted.  GAIT/STANCE: Posture is normal. Gait is steady with normal steps, base, arm swing, and turning.  Heel and toe walking are normal. Tandem gait is normal.  Romberg is absent.  REVIEW OF SYSTEMS:  Full 14 system review of systems performed and notable only for as above All other review of systems were negative.   ALLERGIES: No Known Allergies  HOME MEDICATIONS: Current Outpatient Medications  Medication Sig Dispense Refill   amLODipine-olmesartan (AZOR) 5-20 MG tablet Take 1 tablet by mouth daily.     propranolol ER (INDERAL LA) 60 MG 24 hr capsule Take 60 mg by mouth daily.     sertraline (ZOLOFT) 100 MG tablet Take 150 mg by mouth daily.     vitamin E 200 UNIT capsule Take 200 Units by mouth daily.     zolpidem (AMBIEN) 10 MG tablet Take 5 mg by mouth at bedtime as needed for sleep.     No current facility-administered medications for this visit.    PAST MEDICAL HISTORY: Past Medical History:  Diagnosis Date   Anxiety    Hypertension     PAST SURGICAL HISTORY: Past Surgical History:  Procedure Laterality Date   COLONOSCOPY N/A 09/18/2013   Procedure: COLONOSCOPY;  Surgeon: Jamesetta So, MD;  Location: [facility];  Service: Gastroenterology;  Laterality: N/A;    FAMILY HISTORY: No family history on file.  SOCIAL HISTORY: Social History   Socioeconomic History   Marital status: Married    Spouse name: Not on file   Number of children: Not on file   Years of education: Not on file   Highest education level: Not on file  Occupational History   Not on file  Tobacco Use   Smoking status: Light Smoker    Types: Cigars   Smokeless tobacco: Not on file  Substance and Sexual Activity   Alcohol use: Yes    Alcohol/week: 4.0 standard drinks    Types: 4 drink(s) per week   Drug use: No   Sexual activity: Not on file  Other Topics Concern   Not on file  Social History Narrative   Not on file   Social Determinants of Health   Financial Resource Strain: Not on file  Food Insecurity: Not on file  Transportation Needs: Not on file  Physical Activity: Not  on file  Stress: Not on file  Social Connections: Not on file  Intimate Partner Violence: Not on file      Marcial Pacas, M.D. Ph.D.  [facility] [address] [place], [place] [address] Ph: [phone] Fax: [phone]  CC:  Sharilyn Sites, MD [place],  [place] [address]  Sinda Du, MD

## 2020-10-21 ENCOUNTER — Telehealth: Payer: Self-pay | Admitting: Neurology

## 2020-10-21 NOTE — Telephone Encounter (Signed)
[employer] medicare order sent to [facility]. They will obtain the auth and reach out to th patient to schedule.

## 2020-11-05 ENCOUNTER — Other Ambulatory Visit: Payer: Medicare HMO

## 2020-11-17 ENCOUNTER — Other Ambulatory Visit: Payer: Self-pay

## 2020-12-08 ENCOUNTER — Other Ambulatory Visit: Payer: Self-pay

## 2020-12-08 ENCOUNTER — Ambulatory Visit
Admission: RE | Admit: 2020-12-08 | Discharge: 2020-12-08 | Disposition: A | Discharge status: Home / Self Care | Payer: Medicare HMO | Source: Ambulatory Visit | Attending: Neurology | Admitting: Neurology

## 2020-12-08 DIAGNOSIS — G3184 Mild cognitive impairment, so stated: Secondary | ICD-10-CM | POA: Diagnosis not present

## 2020-12-08 DIAGNOSIS — G250 Essential tremor: Secondary | ICD-10-CM

## 2021-04-20 ENCOUNTER — Ambulatory Visit: Payer: Medicare HMO | Admitting: Neurology

## 2021-04-27 ENCOUNTER — Encounter: Payer: Self-pay | Admitting: Neurology

## 2021-04-27 ENCOUNTER — Ambulatory Visit: Payer: Medicare HMO | Admitting: Neurology

## 2021-04-27 VITALS — BP 121/80 | HR 55 | Ht 68.0 in | Wt 233.5 lb

## 2021-04-27 DIAGNOSIS — G3184 Mild cognitive impairment, so stated: Secondary | ICD-10-CM

## 2021-04-27 DIAGNOSIS — G250 Essential tremor: Secondary | ICD-10-CM

## 2021-04-27 MED ORDER — DONEPEZIL HCL 10 MG PO TABS: 10.0000 mg | ORAL_TABLET | Freq: Every day | ORAL | 11 refills | Status: DC

## 2021-04-27 MED ORDER — MEMANTINE HCL 10 MG PO TABS: 10.0000 mg | ORAL_TABLET | Freq: Two times a day (BID) | ORAL | 11 refills | Status: DC

## 2021-04-27 NOTE — Progress Notes (Signed)
? ?Chief Complaint  ?Patient presents with  ? Follow-up  ?  Rm 15. Alone. ?Pt states memory is consistent. ?[place] 22/30.  ? ? ? ? ?ASSESSMENT AND PLAN ? ?Christopher Lowery is a 71 y.o. male  ?Mild cognitive impairment ? MoCA examination 22/30 ? Father has Alzheimer's disease ? MRI of the brain without contrast in November 2022 was normal ? Laboratory evaluation by his primary care physician ? Emphasize importance of moderate exercise ? Discussed with patient, decided to start Aricept 10 mg daily plus Namenda 20 mg twice a day ? ?Essential tremor ? Family history of essential tremor, symmetric, action tremor, ? Has been treated with Inderal LA 60 mg for more than 20 years, sinus bradycardia, rate around 50, ? He has stopped Inderal LA, taking propanolol as needed ? Return to clinic with issues ? ? ?DIAGNOSTIC DATA (LABS, IMAGING, TESTING) ?- I reviewed patient records, labs, notes, testing and imaging myself where available. ? ? ?MEDICAL HISTORY: ? ?Christopher Lowery, is a 71 year old male, seen in request by his primary care physician Dr. Hilma Favors, Jenny Reichmann, for evaluation of essential tremor, concerning of mild memory loss, initial evaluation was on October 20, 2020 ? ?I reviewed and summarized the referring note. PMHX ?Depression, ?HTN ?Obesity. ? ?He is a Careers information officer at [employer], family history of dementia, his father suffered dementia, around age 18s, died at 85, mother has essential tremor, ? ?He noticed mild bilateral hand shaking around 2000, along with his blood pressure, he started taking Inderal LA 60 mg daily, blood pressure is well controlled, along with amlodipine and olmesartan, his heart rate is sinus bradycardia 50s ? ?Around 2020, he noticed mild memory loss, difficulty came up with names, gradually getting worse ? ?Since 2022, he also began to notice increased bilateral hands tremor, especially when he was doing delicate procedures, began to take nderal immediate release, which has been helpful  some, ? 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 ?We personally reviewed MRI of the brain in November 2022, that was essentially normal ? ?He tried Inderal as needed, seems to help his hand tremor some ? ?He does have obstructive sleep apnea, is using CPAP machine, ? ?PHYSICAL EXAM: ?  ?Vitals:  ? 04/27/21 1540  ?BP: 121/80  ?Pulse: (!) 55  ?Weight: 233 lb 8 oz (105.9 kg)  ?Height: '5\' 8"'$  (1.727 m)  ? ?Not recorded ?  ? ? ?Body mass index is 35.5 kg/m?. ? ?PHYSICAL EXAMNIATION: ? ?Gen: NAD, conversant, well nourised, well groomed              ? ?NEUROLOGICAL EXAM: ? ?MENTAL STATUS: ?Speech: ?   Speech is normal; fluent and spontaneous with normal comprehension.  ?Cognition: ?   ? ?  04/27/2021  ?  3:42 PM 10/20/2020  ?  4:00 PM  ?Montreal Cognitive Assessment   ?Visuospatial/ Executive (0/5) 4 3  ?Naming (0/3) 3 3  ?Attention: Read list of digits (0/2) 1 1  ?Attention: Read list of letters (0/1) 1 1  ?Attention: Serial 7 subtraction starting at 100 (0/3) 3 2  ?Language: Repeat phrase (0/2) 1 2  ?Language : Fluency (0/1) 0 0  ?Abstraction (0/2) 2 2  ?Delayed Recall (0/5) 2 2  ?Orientation (0/6) 5 6  ?Total 22 22  ?Adjusted Score (based on education) 22   ?  ?  ?CRANIAL NERVES: ?CN II: Visual fields are full to  confrontation. Pupils are round equal and briskly reactive to light. ?CN III, IV, VI: extraocular movement are normal. No ptosis. ?CN V: Facial sensation is intact to light touch ?CN VII: Face is symmetric with normal eye closure  ?CN VIII: Hearing is normal to causal conversation. ?CN IX, X: Phonation is normal. ?CN XI: Head turning and shoulder shrug are intact ? ?MOTOR: ?There is no pronator drift of out-stretched arms. Muscle bulk and tone are normal. Muscle strength is normal. ? ?REFLEXES: ?Reflexes are 2+ and symmetric at the biceps, triceps, knees, and ankles. Plantar responses are  flexor. ? ?SENSORY: ?Intact to light touch, pinprick and vibratory sensation are intact in fingers and toes. ? ?COORDINATION: ?There is no trunk or limb dysmetria noted. ? ?GAIT/STANCE: ?Posture is normal. Gait is steady with normal steps, base, arm swing, and turning. Heel and toe walking are normal. Tandem gait is normal.  ?Romberg is absent. ? ?REVIEW OF SYSTEMS:  ?Full 14 system review of systems performed and notable only for as above ?All other review of systems were negative. ? ? ?ALLERGIES: ?No Known Allergies ? ?HOME MEDICATIONS: ?Current Outpatient Medications  ?Medication Sig Dispense Refill  ? amLODipine-olmesartan (AZOR) 5-20 MG tablet Take 1 tablet by mouth daily.    ? propranolol (INDERAL) 10 MG tablet Take 2 tablets (20 mg total) by mouth 3 (three) times daily. 180 tablet 11  ? sertraline (ZOLOFT) 100 MG tablet Take 150 mg by mouth daily.    ? vitamin E 200 UNIT capsule Take 200 Units by mouth daily.    ? zolpidem (AMBIEN) 10 MG tablet Take 5 mg by mouth at bedtime as needed for sleep.    ? ?No current facility-administered medications for this visit.  ? ? ?PAST MEDICAL HISTORY: ?Past Medical History:  ?Diagnosis Date  ? Anxiety   ? Hypertension   ? ? ?PAST SURGICAL HISTORY: ?Past Surgical History:  ?Procedure Laterality Date  ? COLONOSCOPY N/A 09/18/2013  ? Procedure: COLONOSCOPY;  Surgeon: Jamesetta So, MD;  [facility];  Service: Gastroenterology;  Laterality: N/A;  ? ? ?FAMILY HISTORY: ?History reviewed. No pertinent family history. ? ?SOCIAL HISTORY: ?Social History  ? ?Socioeconomic History  ? Marital status: Married  ?  Spouse name: Not on file  ? Number of children: Not on file  ? Years of education: Not on file  ? Highest education level: Not on file  ?Occupational History  ? Not on file  ?Tobacco Use  ? Smoking status: Light Smoker  ?  Types: Cigars  ? Smokeless tobacco: Not on file  ?Substance and Sexual Activity  ? Alcohol use: Yes  ?  Alcohol/week: 4.0 standard drinks  ?   Types: 4 drink(s) per week  ? Drug use: No  ? Sexual activity: Not on file  ?Other Topics Concern  ? Not on file  ?Social History Narrative  ? Not on file  ? ?Social Determinants of Health  ? ?Financial Resource Strain: Not on file  ?Food Insecurity: Not on file  ?Transportation Needs: Not on file  ?Physical Activity: Not on file  ?Stress: Not on file  ?Social Connections: Not on file  ?Intimate Partner Violence: Not on file  ? ? ? ? ?Marcial Pacas, M.D. Ph.D. ? ?[facility] ?[address], [address] ?[place], [place] [address] ?Ph: [phone] ?Fax: [phone] ? ?CC:  Sinda Du, MD ?No address on file  [place], [place]   ?

## 2021-04-27 NOTE — Patient Instructions (Signed)
Meds ordered this encounter  ?Medications  ? donepezil (ARICEPT) 10 MG tablet  ?  Sig: Take 1 tablet (10 mg total) by mouth at bedtime.  ?  Dispense:  30 tablet  ?  Refill:  11  ? memantine (NAMENDA) 10 MG tablet  ?  Sig: Take 1 tablet (10 mg total) by mouth 2 (two) times daily.  ?  Dispense:  60 tablet  ?  Refill:  11  ?   ?

## 2021-07-06 ENCOUNTER — Encounter: Payer: Self-pay | Admitting: Orthopedic Surgery

## 2021-07-06 ENCOUNTER — Ambulatory Visit (INDEPENDENT_AMBULATORY_CARE_PROVIDER_SITE_OTHER): Payer: Medicare HMO

## 2021-07-06 ENCOUNTER — Ambulatory Visit: Payer: Medicare HMO | Admitting: Orthopedic Surgery

## 2021-07-06 VITALS — BP 126/75 | HR 52 | Ht 68.0 in | Wt 220.0 lb

## 2021-07-06 DIAGNOSIS — E663 Overweight: Secondary | ICD-10-CM | POA: Diagnosis not present

## 2021-07-06 DIAGNOSIS — M25511 Pain in right shoulder: Secondary | ICD-10-CM

## 2021-07-06 DIAGNOSIS — R5382 Chronic fatigue, unspecified: Secondary | ICD-10-CM | POA: Diagnosis not present

## 2021-07-06 DIAGNOSIS — G8929 Other chronic pain: Secondary | ICD-10-CM

## 2021-07-06 DIAGNOSIS — E162 Hypoglycemia, unspecified: Secondary | ICD-10-CM | POA: Diagnosis not present

## 2021-07-06 DIAGNOSIS — E6609 Other obesity due to excess calories: Secondary | ICD-10-CM | POA: Diagnosis not present

## 2021-07-06 DIAGNOSIS — E559 Vitamin D deficiency, unspecified: Secondary | ICD-10-CM | POA: Diagnosis not present

## 2021-07-06 DIAGNOSIS — L658 Other specified nonscarring hair loss: Secondary | ICD-10-CM | POA: Diagnosis not present

## 2021-07-06 NOTE — Progress Notes (Signed)
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  C/O PAIN RIGHT SHOULDER   INJ 6 WEEKS AGO   NOW GETTING BETTER   PAIN IS ANTERIOR IN THE ROTATOR INTERVAL   Past Medical History:  Diagnosis Date   Anxiety    Hypertension     BP 126/75   Pulse (!) 52   Ht '5\' 8"'$  (1.727 m)   Wt 220 lb (99.8 kg)   BMI 33.45 kg/m    General appearance: Well-developed well-nourished no gross deformities  Cardiovascular normal pulse and perfusion normal color without edema  Neurologically no sensation loss or deficits or pathologic reflexes  Psychological: Awake alert and oriented x3 mood and affect normal  Skin no lacerations or ulcerations no nodularity no palpable masses, no erythema or nodularity  Musculoskeletal: Right shoulder tenderness over the rotator interval.  The patient has full range of motion.  He has excellent strength in the empty can position as well as in abduction.  He does have pain with resisted range of motion no external rotation deficit.  No instability.  Imaging NORMAL OA   A/P  Encounter Diagnosis  Name Primary?   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  Seems to be getting better.  Recommend exercises and subacromial injection.  Follow-up in 6 weeks.  Clinically does not appear to have rotator cuff tear but may be it is partial and small if at all.  SHOULDER INJ RIGHT SIDE   INJECTION AND HOME PT   FU 6 WEEKS    Procedure note the subacromial injection shoulder RIGHT    Verbal consent was obtained to inject the  RIGHT   Shoulder  Timeout was completed to confirm the injection site is a subacromial space of the  RIGHT  shoulder   Medication used Depo-Medrol 40 mg and lidocaine 1% 3 cc  Anesthesia was provided by ethyl chloride  The injection was performed in the RIGHT  posterior subacromial space. After  pinning the skin with alcohol and anesthetized the skin with ethyl chloride the subacromial space was injected using a 20-gauge needle. There were no complications  Sterile dressing was applied.

## 2021-07-13 ENCOUNTER — Ambulatory Visit: Payer: Medicare HMO | Admitting: Orthopaedic Surgery

## 2021-08-17 ENCOUNTER — Ambulatory Visit: Payer: Medicare HMO | Admitting: Orthopedic Surgery

## 2021-08-17 ENCOUNTER — Encounter: Payer: Self-pay | Admitting: Orthopedic Surgery

## 2021-08-17 DIAGNOSIS — M25511 Pain in right shoulder: Secondary | ICD-10-CM

## 2021-08-17 DIAGNOSIS — S46011D Strain of muscle(s) and tendon(s) of the rotator cuff of right shoulder, subsequent encounter: Secondary | ICD-10-CM

## 2021-08-17 NOTE — Progress Notes (Signed)
Chief Complaint  Patient presents with   Shoulder Pain    Right feeling better    Christopher Lowery is coming back after injection right shoulder and 6 weeks of physical therapy.  He still has weakness and pain in his right shoulder he has pain with forward elevation he has pain and weakness in the empty can position he has pain when bringing the arm from a forward elevation down to a normal position  Recommend MRI of his right shoulder to rule out rotator cuff tear follow-up for results  Encounter Diagnoses  Name Primary?   Acute pain of right shoulder Yes   Traumatic complete tear of right rotator cuff, subsequent encounter

## 2021-08-17 NOTE — Patient Instructions (Signed)
While we are working on your approval for MRI please go ahead and call to schedule your appointment with [facility] within at least one (1) week.   Central Scheduling [phone]  

## 2021-08-24 ENCOUNTER — Ambulatory Visit (HOSPITAL_COMMUNITY)
Admission: RE | Admit: 2021-08-24 | Discharge: 2021-08-24 | Disposition: A | Discharge status: Home / Self Care | Payer: Medicare HMO | Source: Ambulatory Visit | Attending: Orthopedic Surgery | Admitting: Orthopedic Surgery

## 2021-08-24 DIAGNOSIS — M25511 Pain in right shoulder: Secondary | ICD-10-CM | POA: Insufficient documentation

## 2021-08-31 DIAGNOSIS — H25811 Combined forms of age-related cataract, right eye: Secondary | ICD-10-CM | POA: Diagnosis not present

## 2021-09-07 ENCOUNTER — Ambulatory Visit: Payer: Medicare HMO | Admitting: Orthopedic Surgery

## 2021-09-07 ENCOUNTER — Encounter: Payer: Self-pay | Admitting: Orthopedic Surgery

## 2021-09-07 DIAGNOSIS — S46011D Strain of muscle(s) and tendon(s) of the rotator cuff of right shoulder, subsequent encounter: Secondary | ICD-10-CM

## 2021-09-07 NOTE — Addendum Note (Signed)
Addended by: Obie Dredge A on: 09/07/2021 03:54 PM   Modules accepted: Orders

## 2021-09-07 NOTE — Progress Notes (Signed)
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  He my opinion he has a torn rotator cuff is a small tear is less than a centimeter in all directions  We discussed his wishes and he agrees that physical therapy is probably the best thing now at least to try that first.  He understands that you can have tear progression and the signs of that are increased pain  We will start physical therapy follow-up in 8 weeks to recheck his strength and range of motion

## 2021-09-08 NOTE — Addendum Note (Signed)
Addended byCandice Camp on: 09/08/2021 08:46 AM   Modules accepted: Orders

## 2021-09-15 DIAGNOSIS — I10 Essential (primary) hypertension: Secondary | ICD-10-CM | POA: Diagnosis not present

## 2021-09-15 DIAGNOSIS — Z6831 Body mass index (BMI) 31.0-31.9, adult: Secondary | ICD-10-CM | POA: Diagnosis not present

## 2021-09-15 DIAGNOSIS — Z125 Encounter for screening for malignant neoplasm of prostate: Secondary | ICD-10-CM | POA: Diagnosis not present

## 2021-09-15 DIAGNOSIS — E559 Vitamin D deficiency, unspecified: Secondary | ICD-10-CM | POA: Diagnosis not present

## 2021-09-15 DIAGNOSIS — M109 Gout, unspecified: Secondary | ICD-10-CM | POA: Diagnosis not present

## 2021-09-15 DIAGNOSIS — Z0001 Encounter for general adult medical examination with abnormal findings: Secondary | ICD-10-CM | POA: Diagnosis not present

## 2021-09-15 DIAGNOSIS — E7849 Other hyperlipidemia: Secondary | ICD-10-CM | POA: Diagnosis not present

## 2021-09-15 DIAGNOSIS — Z1331 Encounter for screening for depression: Secondary | ICD-10-CM | POA: Diagnosis not present

## 2021-09-15 DIAGNOSIS — E782 Mixed hyperlipidemia: Secondary | ICD-10-CM | POA: Diagnosis not present

## 2021-09-21 ENCOUNTER — Ambulatory Visit (HOSPITAL_COMMUNITY): Payer: Medicare HMO | Attending: Orthopedic Surgery | Admitting: Occupational Therapy

## 2021-09-21 ENCOUNTER — Encounter (HOSPITAL_COMMUNITY): Payer: Self-pay | Admitting: Occupational Therapy

## 2021-09-21 DIAGNOSIS — R29898 Other symptoms and signs involving the musculoskeletal system: Secondary | ICD-10-CM | POA: Insufficient documentation

## 2021-09-21 DIAGNOSIS — M25511 Pain in right shoulder: Secondary | ICD-10-CM | POA: Diagnosis not present

## 2021-09-21 NOTE — Patient Instructions (Signed)

## 2021-09-21 NOTE — Therapy (Signed)
OUTPATIENT OCCUPATIONAL THERAPY ORTHO EVALUATION  Patient Name: Christopher Lowery MRN: [id number] DOB:September 21, 1950, 71 y.o., male Today's Date: 09/21/2021  PCP: Dr. Sharilyn Sites REFERRING PROVIDER: Dr. Arther Abbott   OT End of Session - 09/21/21 0951     Visit Number 1    Number of Visits 4    Date for OT Re-Evaluation 10/21/21    Authorization Type [employer] Medicare, $25 copay    Authorization Time Period no auth required    Progress Note Due on Visit 10    OT Start Time [phone]    OT Stop Time 0930    OT Time Calculation (min) 35 min    Activity Tolerance Patient tolerated treatment well    Behavior During Therapy [facility] for tasks assessed/performed             Past Medical History:  Diagnosis Date   Anxiety    Hypertension    Past Surgical History:  Procedure Laterality Date   COLONOSCOPY N/A 09/18/2013   Procedure: COLONOSCOPY;  Surgeon: Jamesetta So, MD;  [facility];  Service: Gastroenterology;  Laterality: N/A;   Patient Active Problem List   Diagnosis Date Noted   Mild cognitive impairment 10/20/2020   Essential tremor 10/20/2020    ONSET DATE: February 2023  REFERRING DIAG: Right RC Tear  THERAPY DIAG:  Acute pain of right shoulder  Other symptoms and signs involving the musculoskeletal system  Rationale for Evaluation and Treatment Rehabilitation  SUBJECTIVE:   SUBJECTIVE STATEMENT: S: I went down like a tree in the forest.  Pt accompanied by: self  PERTINENT HISTORY: Pt is a 71 y/o male presenting with right RC tear sustained after tripping over a dog in February 2023. Pt received an injection in March with little to no relief.   PRECAUTIONS: None  WEIGHT BEARING RESTRICTIONS No  PAIN:  Are you having pain? Yes: NPRS scale: 1/10 Pain location: right shoulder Pain description: acing Aggravating factors: lifting arm causes pain to increase to a 10, riding in the car, sleeping on the right Relieving factors: resting  FALLS: Has  patient fallen in last 6 months? Yes. Number of falls 1  PLOF: Independent  PATIENT GOALS To have less pain and be able to use the right arm.   OBJECTIVE:   HAND DOMINANCE: Right  ADLs: Overall ADLs: Pt is having difficulty with sleeping, lifting and reaching for items, reaching out to the right side like when drying off. Pt is not lifting items, trying to avoid motions that cause pain. Lifting items such as a gallon of tea, putting diesel fuel in tractor. A little pain while driving the tractor.  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    FUNCTIONAL OUTCOME MEASURES: FOTO: Complete next session  UPPER EXTREMITY ROM       Assessed seated, er/IR adducted. P/ROM is [facility]  Active ROM Right eval  Shoulder flexion 168  Shoulder abduction 151  Shoulder internal rotation 90  Shoulder external rotation 68    UPPER EXTREMITY MMT:     MMT Right eval  Shoulder flexion 5/5  Shoulder abduction 4+/5  Shoulder internal rotation 5/5  Shoulder external rotation 4+/5    HAND FUNCTION: Grip strength: Right: 94 lbs; Left: 105 lbs  SENSATION: WFL   COGNITION: Overall cognitive status: Within functional limits for tasks assessed   TODAY'S TREATMENT:  N/A-eval only    PATIENT EDUCATION: Education details: A/ROM Person  educated: Patient Education method: Explanation, Demonstration, and Handouts Education comprehension: verbalized understanding and returned demonstration   HOME EXERCISE PROGRAM: Eval: A/ROM  GOALS: Goals reviewed with patient? Yes  SHORT TERM GOALS: Target date: 10/19/2021    Pt will be provided with and educated on HEP to improve mobility and activity tolerance required for daily ADL completion.   Goal status: INITIAL  2.  Pt will decrease pain in RUE to 4/10 or less to improve ability to sleep in preferred position for 2+ consecutive hours.   Goal status: INITIAL  3.  Pt will  decrease fascial restrictions to trace amounts in RUE to improve mobility required for daily functional reaching tasks.    Goal status: INITIAL  4.  Pt will increase activity tolerance required for outdoor work by completing outdoor or leisure tasks with less than 2 rest breaks.  Goal status: INITIAL  5.  Pt will increase RUE strength to 5/5 throughout to improve ability to lift tea or swing a golf club with minimal pain.  Goal status: INITIAL    ASSESSMENT:  CLINICAL IMPRESSION: Patient is a 71 y.o. male who was seen today for occupational therapy evaluation for right RC tear. Pt sustained the tear in February, received injection in March, continues to have pain and reports pain has increased slightly since injury.  Pt maintains good ROM and strength, however has pain with reaching and lifting tasks. Discussed therapy with goal of reducing pain and strengthening surrounding musculature to improve ability to complete ADL tasks.   PERFORMANCE DEFICITS in functional skills including ADLs, IADLs, ROM, strength, pain, fascial restrictions, muscle spasms, and UE functional use  IMPAIRMENTS are limiting patient from ADLs, IADLs, rest and sleep, work, and leisure.   COMORBIDITIES has no other co-morbidities that affects occupational performance. Patient will benefit from skilled OT to address above impairments and improve overall function.  MODIFICATION OR ASSISTANCE TO COMPLETE EVALUATION: No modification of tasks or assist necessary to complete an evaluation.  OT OCCUPATIONAL PROFILE AND HISTORY: Problem focused assessment: Including review of records relating to presenting problem.  CLINICAL DECISION MAKING: LOW - limited treatment options, no task modification necessary  REHAB POTENTIAL: Good  EVALUATION COMPLEXITY: Low      PLAN: OT FREQUENCY: 1x/week  OT DURATION: 4 weeks  PLANNED INTERVENTIONS: self care/ADL training, therapeutic exercise, therapeutic activity, manual  therapy, passive range of motion, electrical stimulation, ultrasound, cryotherapy, patient/family education, and DME and/or AE instructions   CONSULTED AND AGREED WITH PLAN OF CARE: Patient  PLAN FOR NEXT SESSION: Follow up on HEP, complete proximal shoulder strengthening, sidelying A/ROM, scapular theraband   Guadelupe Sabin, OTR/L  [phone] 09/21/2021, 9:52 AM

## 2021-09-28 ENCOUNTER — Encounter (HOSPITAL_COMMUNITY): Payer: Self-pay

## 2021-09-28 ENCOUNTER — Ambulatory Visit (HOSPITAL_COMMUNITY): Payer: Medicare HMO

## 2021-09-28 DIAGNOSIS — R29898 Other symptoms and signs involving the musculoskeletal system: Secondary | ICD-10-CM

## 2021-09-28 DIAGNOSIS — M25511 Pain in right shoulder: Secondary | ICD-10-CM | POA: Diagnosis not present

## 2021-09-28 NOTE — Patient Instructions (Signed)
  1) (Clinic) Extension / Flexion (Assist)   Face anchor, pull arms back, keeping elbow straight, and squeze shoulder blades together. Repeat 10-15 times. 1-3 times/day.   Copyright  VHI. All rights reserved.   2) (Home) Retraction: Row - Bilateral (Anchor)   Facing anchor, arms reaching forward, pull hands toward stomach, keeping elbows bent and at your sides and pinching shoulder blades together. Repeat 10-15 times. 1-3 times/day.   Copyright  VHI. All rights reserved.

## 2021-09-28 NOTE — Therapy (Signed)
OUTPATIENT OCCUPATIONAL THERAPY TREATMENT NOTE   Patient Name: Christopher Lowery MRN: [id number] DOB:04-Jan-1951, 71 y.o., male Today's Date: 09/28/2021  PCP: Dr. Sharilyn Sites REFERRING PROVIDER: Dr. Arther Abbott   OT End of Session - 09/28/21 1502     Visit Number 2    Number of Visits 4    Date for OT Re-Evaluation 10/21/21    Authorization Type [employer] Medicare, $25 copay    Authorization Time Period no auth required    Progress Note Due on Visit 10    OT Start Time 1301    OT Stop Time 1346    OT Time Calculation (min) 45 min    Activity Tolerance Patient tolerated treatment well    Behavior During Therapy [facility] for tasks assessed/performed             Past Medical History:  Diagnosis Date   Anxiety    Hypertension    Past Surgical History:  Procedure Laterality Date   COLONOSCOPY N/A 09/18/2013   Procedure: COLONOSCOPY;  Surgeon: Jamesetta So, MD;  [facility];  Service: Gastroenterology;  Laterality: N/A;   Patient Active Problem List   Diagnosis Date Noted   Mild cognitive impairment 10/20/2020   Essential tremor 10/20/2020    ONSET DATE: Feb 2023  REFERRING DIAG: Right RC Tear  THERAPY DIAG:  Acute pain of right shoulder  Other symptoms and signs involving the musculoskeletal system  Rationale for Evaluation and Treatment Rehabilitation  PERTINENT HISTORY: Pt is a 71 y/o male presenting with right RC tear sustained after tripping over a dog in February 2023. Pt received an injection in March with little to no relief.     PRECAUTIONS: none   SUBJECTIVE: no  PAIN:  Are you having pain? Yes: NPRS scale: 1/10 Pain location: right shoulder Pain description: acing Aggravating factors: lifting arm causes pain to increase to a 10, riding in the car, sleeping on the right Relieving factors: resting   OBJECTIVE:    HAND DOMINANCE: Right   ADLs: Overall ADLs: Pt is having difficulty with sleeping, lifting and reaching for items,  reaching out to the right side like when drying off. Pt is not lifting items, trying to avoid motions that cause pain. Lifting items such as a gallon of tea, putting diesel fuel in tractor. A little pain while driving the tractor.  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      FUNCTIONAL OUTCOME MEASURES: FOTO: Complete next session   UPPER EXTREMITY ROM                    Assessed seated, er/IR adducted. P/ROM is [facility]   Active ROM Right eval  Shoulder flexion 168  Shoulder abduction 151  Shoulder internal rotation 90  Shoulder external rotation 68      UPPER EXTREMITY MMT:      MMT Right eval  Shoulder flexion 5/5  Shoulder abduction 4+/5  Shoulder internal rotation 5/5  Shoulder external rotation 4+/5      HAND FUNCTION: Grip strength: Right: 94 lbs; Left: 105 lbs   SENSATION: WFL   COGNITION: Overall cognitive status: Within functional limits for tasks assessed      GOALS: Goals reviewed with patient? Yes   SHORT TERM GOALS: Target date: 10/19/2021     Pt will be provided with and educated on HEP to improve mobility and activity tolerance required for daily ADL  completion.    Goal status: INITIAL   2.  Pt will decrease pain in RUE to 4/10 or less to improve ability to sleep in preferred position for 2+ consecutive hours.    Goal status: INITIAL   3.  Pt will decrease fascial restrictions to trace amounts in RUE to improve mobility required for daily functional reaching tasks.    Goal status: INITIAL   4.  Pt will increase activity tolerance required for outdoor work by completing outdoor or leisure tasks with less than 2 rest breaks.   Goal status: INITIAL   5.  Pt will increase RUE strength to 5/5 throughout to improve ability to lift tea or swing a golf club with minimal pain.   Goal status: INITIAL   TODAY'S TREATMENT:  -Manual therapy: soft tissue mobilization, myofascial  release, and trigger point techniques to address fascial restrictions and decrease pain to allow for pain free ROM -Sidelying abduction, 1x10  -Sidelying er/IR, 1x10, #2 -Proximal shoulder strengthening: paddles, criss cross, CW circles, CCW circles, 30 seconds each, no rest break  -Body Blade: 2x30 seconds each elbow flexion, elbow extension -Theraband strengthening: blue theraband. 1x10 row and extension with 3 second hold -Theraband IR/er, blue theraband, 1x10   PATIENT EDUCATION: Education details: Red theraband row and extension  Person educated: Patient Education method: Explanation, Demonstration, and Handouts Education comprehension: verbalized understanding and returned demonstration   HOME EXERCISE PROGRAM Eval: A/ROM 8/24: Red theraband row and extension   ASSESSMENT:   CLINICAL IMPRESSION: A:Pt presents today with some pain/discomfort with shoulder movement. He reports completing daily HEP with some benefit. Started with sidelying A/ROM with pt reporting restriction and pain with movement, addressed with manual therapy, and able to return to A/ROM with decreased pain and restriction. He continues with shoulder ROM WFL. Moderate tactile cues for new exercises today to strengthen shoulder stabilizing muscles. Added to HEP.    PLAN:  OT FREQUENCY: 1x/week   OT DURATION: 4 weeks   PLANNED INTERVENTIONS: self care/ADL training, therapeutic exercise, therapeutic activity, manual therapy, passive range of motion, electrical stimulation, ultrasound, cryotherapy, patient/family education, and DME and/or AE instructions     CONSULTED AND AGREED WITH PLAN OF CARE: Patient   PLAN FOR NEXT SESSION: Follow up on HEP, complete proximal shoulder strengthening, sidelying A/ROM, scapular theraband. [place], OTR/L [phone]  09/28/2021, 3:04 PM

## 2021-10-05 ENCOUNTER — Ambulatory Visit (HOSPITAL_COMMUNITY): Payer: Medicare HMO

## 2021-10-05 ENCOUNTER — Encounter (HOSPITAL_COMMUNITY): Payer: Self-pay

## 2021-10-05 DIAGNOSIS — L658 Other specified nonscarring hair loss: Secondary | ICD-10-CM | POA: Diagnosis not present

## 2021-10-05 DIAGNOSIS — D509 Iron deficiency anemia, unspecified: Secondary | ICD-10-CM | POA: Diagnosis not present

## 2021-10-05 DIAGNOSIS — E559 Vitamin D deficiency, unspecified: Secondary | ICD-10-CM | POA: Diagnosis not present

## 2021-10-05 DIAGNOSIS — R29898 Other symptoms and signs involving the musculoskeletal system: Secondary | ICD-10-CM | POA: Diagnosis not present

## 2021-10-05 DIAGNOSIS — M25511 Pain in right shoulder: Secondary | ICD-10-CM

## 2021-10-05 DIAGNOSIS — R7309 Other abnormal glucose: Secondary | ICD-10-CM | POA: Diagnosis not present

## 2021-10-05 DIAGNOSIS — R3915 Urgency of urination: Secondary | ICD-10-CM | POA: Diagnosis not present

## 2021-10-05 NOTE — Therapy (Signed)
OUTPATIENT OCCUPATIONAL THERAPY TREATMENT NOTE   Patient Name: Christopher Lowery MRN: [id number] DOB:23-Mar-1950, 71 y.o., male Today's Date: 10/05/2021  PCP: Dr. Sharilyn Sites REFERRING PROVIDER: Dr. Arther Abbott   OT End of Session - 10/05/21 0909     Visit Number 3    Number of Visits 4    Date for OT Re-Evaluation 10/21/21    Authorization Type [employer], $25 copay    Authorization Time Period no auth required    Progress Note Due on Visit 10    OT Start Time 0908    OT Stop Time 0946    OT Time Calculation (min) 38 min    Activity Tolerance Patient tolerated treatment well    Behavior During Therapy [facility] for tasks assessed/performed             Past Medical History:  Diagnosis Date   Anxiety    Hypertension    Past Surgical History:  Procedure Laterality Date   COLONOSCOPY N/A 09/18/2013   Procedure: COLONOSCOPY;  Surgeon: Jamesetta So, MD;  Location: [facility];  Service: Gastroenterology;  Laterality: N/A;   Patient Active Problem List   Diagnosis Date Noted   Mild cognitive impairment 10/20/2020   Essential tremor 10/20/2020    ONSET DATE: Feb 2023  REFERRING DIAG: Right RC Tear  THERAPY DIAG:  Acute pain of right shoulder  Other symptoms and signs involving the musculoskeletal system  Rationale for Evaluation and Treatment Rehabilitation  PERTINENT HISTORY: Pt is a 71 y/o male presenting with right RC tear sustained after tripping over a dog in February 2023. Pt received an injection in March with little to no relief.     PRECAUTIONS: none   SUBJECTIVE: S: "It is painful when I try to lift things that are a little heavier."  PAIN:  Are you having pain? Yes: NPRS scale: 1/10 Pain location: right shoulder Pain description: acing Aggravating factors: lifting arm causes pain to increase to a 10, riding in the car, sleeping on the right Relieving factors: resting   OBJECTIVE:    HAND DOMINANCE: Right   ADLs: Overall ADLs: Pt is  having difficulty with sleeping, lifting and reaching for items, reaching out to the right side like when drying off. Pt is not lifting items, trying to avoid motions that cause pain. Lifting items such as a gallon of tea, putting diesel fuel in tractor. A little pain while driving the tractor.  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      FUNCTIONAL OUTCOME MEASURES: FOTO: Complete next session   UPPER EXTREMITY ROM                    Assessed seated, er/IR adducted. P/ROM is [facility]   Active ROM Right eval  Shoulder flexion 168  Shoulder abduction 151  Shoulder internal rotation 90  Shoulder external rotation 68      UPPER EXTREMITY MMT:      MMT Right eval  Shoulder flexion 5/5  Shoulder abduction 4+/5  Shoulder internal rotation 5/5  Shoulder external rotation 4+/5      HAND FUNCTION: Grip strength: Right: 94 lbs; Left: 105 lbs   SENSATION: WFL   COGNITION: Overall cognitive status: Within functional limits for tasks assessed      GOALS: Goals reviewed with patient? Yes   SHORT TERM GOALS: Target date: 10/19/2021     Pt will be provided with  and educated on HEP to improve mobility and activity tolerance required for daily ADL completion.    Goal status: IN PROGRESS    2.  Pt will decrease pain in RUE to 4/10 or less to improve ability to sleep in preferred position for 2+ consecutive hours.    Goal status: IN PROGRESS      3.  Pt will decrease fascial restrictions to trace amounts in RUE to improve mobility required for daily functional reaching tasks.    Goal status: IN PROGRESS      4.  Pt will increase activity tolerance required for outdoor work by completing outdoor or leisure tasks with less than 2 rest breaks.   Goal status: IN PROGRESS      5.  Pt will increase RUE strength to 5/5 throughout to improve ability to lift tea or swing a golf club with minimal pain.   Goal  status: IN PROGRESS      TODAY'S TREATMENT:   10/05/21  -Manual therapy: soft tissue mobilization, myofascial release, and trigger point techniques to address fascial restrictions and decrease pain to allow for pain free ROM -Forward flexion, 3#, 1x10, abduction, 3#, 1x10, bicep curl to overhead press, 4# -Bent over row, 4#, 1x10 -Body Blade: 2x30 seconds each elbow flexion, elbow extension -Theraband strengthening: blue theraband, 1x10 row and extension with 3 second hold -Theraband IR/er, blue theraband, 1x10 -UBE: arm bike, level, 3' forward    09/28/21 -Manual therapy: soft tissue mobilization, myofascial release, and trigger point techniques to address fascial restrictions and decrease pain to allow for pain free ROM -Sidelying abduction, 1x10  -Sidelying er/IR, 1x10, #2 -Proximal shoulder strengthening: paddles, criss cross, CW circles, CCW circles, 30 seconds each, no rest break  -Body Blade: 2x30 seconds each elbow flexion, elbow extension -Theraband strengthening: blue theraband. 1x10 row and extension with 3 second hold -Theraband IR/er, blue theraband, 1x10   PATIENT EDUCATION: Education details: Shoulder strengthening (flexion and abduction to 90 w/ dumbbell), theraband IR/er Person educated: Patient Education method: Explanation, Demonstration, and Handouts Education comprehension: verbalized understanding and returned demonstration   HOME EXERCISE PROGRAM Eval: A/ROM 8/24: Red theraband row and extension 8/31: Shoulder strengthening (flexion and abduction to 90 w/ dumbbell), theraband IR/er   ASSESSMENT:   CLINICAL IMPRESSION: A:Pt continues with ROM WFL, however, some decreased shoulder strength and endurance and pain with some movements. Introduced additional shoulder strengthening, moderate verbal cues initially for form, added to HEP to continue progressing strength of shoulder stabilizing muscles. Minimal fascial restrictions noted, most occurring at upper  trapezius.    PLAN:  OT FREQUENCY: 1x/week   OT DURATION: 4 weeks   PLANNED INTERVENTIONS: self care/ADL training, therapeutic exercise, therapeutic activity, manual therapy, passive range of motion, electrical stimulation, ultrasound, cryotherapy, patient/family education, and DME and/or AE instructions     CONSULTED AND AGREED WITH PLAN OF CARE: Patient   PLAN FOR NEXT SESSION: Follow up on HEP, complete proximal shoulder strengthening, sidelying A/ROM, [address], [place], OTR/L [phone]  10/05/2021, 4:55 PM

## 2021-10-12 ENCOUNTER — Encounter (HOSPITAL_COMMUNITY): Payer: Self-pay | Admitting: Occupational Therapy

## 2021-10-12 ENCOUNTER — Ambulatory Visit (HOSPITAL_COMMUNITY): Payer: Medicare HMO | Attending: Orthopedic Surgery | Admitting: Occupational Therapy

## 2021-10-12 DIAGNOSIS — M25511 Pain in right shoulder: Secondary | ICD-10-CM | POA: Diagnosis not present

## 2021-10-12 DIAGNOSIS — R29898 Other symptoms and signs involving the musculoskeletal system: Secondary | ICD-10-CM | POA: Diagnosis not present

## 2021-10-12 NOTE — Patient Instructions (Signed)

## 2021-10-12 NOTE — Therapy (Signed)
OUTPATIENT OCCUPATIONAL THERAPY REASSESSMENT & TREATMENT NOTE DISCHARGE SUMMARY   Patient Name: Christopher Lowery MRN: [id number] DOB:07/14/1950, 71 y.o., male Today's Date: 10/12/2021  PCP: Dr. Sharilyn Sites REFERRING PROVIDER: Dr. Arther Abbott   OT End of Session - 10/12/21 0939     Visit Number 4    Number of Visits 4    Date for OT Re-Evaluation 10/21/21    Authorization Type [employer], $25 copay    Authorization Time Period no auth required    Progress Note Due on Visit 10    OT Start Time 0905    OT Stop Time 0935    OT Time Calculation (min) 30 min    Activity Tolerance Patient tolerated treatment well    Behavior During Therapy [facility] for tasks assessed/performed              Past Medical History:  Diagnosis Date   Anxiety    Hypertension    Past Surgical History:  Procedure Laterality Date   COLONOSCOPY N/A 09/18/2013   Procedure: COLONOSCOPY;  Surgeon: Jamesetta So, MD;  Location: [facility];  Service: Gastroenterology;  Laterality: N/A;   Patient Active Problem List   Diagnosis Date Noted   Mild cognitive impairment 10/20/2020   Essential tremor 10/20/2020    ONSET DATE: Feb 2023  REFERRING DIAG: Right RC Tear  THERAPY DIAG:  Acute pain of right shoulder  Other symptoms and signs involving the musculoskeletal system  Rationale for Evaluation and Treatment Rehabilitation  PERTINENT HISTORY: Pt is a 71 y/o male presenting with right RC tear sustained after tripping over a dog in February 2023. Pt received an injection in March with little to no relief.     PRECAUTIONS: none   SUBJECTIVE: S: "It's maybe a little better."   PAIN:  Are you having pain? Yes: NPRS scale: 1/10 (when not doing anything) Pain location: right shoulder Pain description: acing Aggravating factors: lifting arm causes pain to increase to a 10, riding in the car, sleeping on the right Relieving factors: resting   OBJECTIVE:    HAND DOMINANCE: Right    ADLs: Overall ADLs: Pt is having difficulty with sleeping, lifting and reaching for items, reaching out to the right side like when drying off. Pt is not lifting items, trying to avoid motions that cause pain. Lifting items such as a gallon of tea, putting diesel fuel in tractor. A little pain while driving the tractor.  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      FUNCTIONAL OUTCOME MEASURES: FOTO: Complete next session   UPPER EXTREMITY ROM                    Assessed seated, er/IR adducted. P/ROM is [facility]   Active ROM Right eval Right 10/12/21  Shoulder flexion 168 170  Shoulder abduction 151 161  Shoulder internal rotation 90 90  Shoulder external rotation 68 79      UPPER EXTREMITY MMT:      MMT Right eval Right 10/12/21  Shoulder flexion 5/5 5/5  Shoulder abduction 4+/5 5/5  Shoulder internal rotation 5/5 5/5  Shoulder external rotation 4+/5 4+/5      HAND FUNCTION: Grip strength: Right: 94 lbs; Left: 105 lbs 10/12/21: Right: 114 lbs; left: 115 lbs      GOALS: Goals reviewed with patient? Yes   SHORT TERM GOALS: Target date: 10/19/2021     Pt  will be provided with and educated on HEP to improve mobility and activity tolerance required for daily ADL completion.    Goal status: MET   2.  Pt will decrease pain in RUE to 4/10 or less to improve ability to sleep in preferred position for 2+ consecutive hours.    Goal status: NOT MET     3.  Pt will decrease fascial restrictions to trace amounts in RUE to improve mobility required for daily functional reaching tasks.    Goal status: PARTIALLY MET     4.  Pt will increase activity tolerance required for outdoor work by completing outdoor or leisure tasks with less than 2 rest breaks.   Goal status: MET     5.  Pt will increase RUE strength to 5/5 throughout to improve ability to lift tea or swing a golf club with minimal pain.   Goal  status: NOT MET     TODAY'S TREATMENT:   10/12/21  -Manual therapy: soft tissue mobilization, myofascial release, and trigger point techniques to address fascial restrictions and decrease pain to allow for pain free ROM -A/ROM: standing, protraction, flexion, horizontal abduction, er/IR, abduction, 10 reps each -Red theraband: row, extension, 10 reps each    10/05/21  -Manual therapy: soft tissue mobilization, myofascial release, and trigger point techniques to address fascial restrictions and decrease pain to allow for pain free ROM -Forward flexion, 3#, 1x10, abduction, 3#, 1x10, bicep curl to overhead press, 4# -Bent over row, 4#, 1x10 -Body Blade: 2x30 seconds each elbow flexion, elbow extension -Theraband strengthening: blue theraband, 1x10 row and extension with 3 second hold -Theraband IR/er, blue theraband, 1x10 -UBE: arm bike, level, 3' forward    09/28/21 -Manual therapy: soft tissue mobilization, myofascial release, and trigger point techniques to address fascial restrictions and decrease pain to allow for pain free ROM -Sidelying abduction, 1x10  -Sidelying er/IR, 1x10, #2 -Proximal shoulder strengthening: paddles, criss cross, CW circles, CCW circles, 30 seconds each, no rest break  -Body Blade: 2x30 seconds each elbow flexion, elbow extension -Theraband strengthening: blue theraband. 1x10 row and extension with 3 second hold -Theraband IR/er, blue theraband, 1x10   PATIENT EDUCATION: Education details: A/ROM Person educated: Patient Education method: Consulting civil engineer, Media planner, and Handouts Education comprehension: verbalized understanding and returned demonstration   HOME EXERCISE PROGRAM Eval: A/ROM 8/24: Red theraband row and extension 8/31: Shoulder strengthening (flexion and abduction to 90 w/ dumbbell), theraband IR/er 9/7: A/ROM   ASSESSMENT:   CLINICAL IMPRESSION: A: Reassessment completed this session, pt has met 2/5 goals and partially met 2  additional goals. Pt demonstrates improved ROM and strength, both are WNL at this time. Pt reports continued pain, mostly short lived with certain motions, however continues to be unable to sleep on his right side. Discussed modalities for pain management including ice, heat, and TENS unit use. Also discussed pacing himself with strenuous tasks and limiting the amount of heavy work he does at one time. Reviewed HEP and encouraged continued completion of all exercises, can break up and complete on different days. Discussed future surgical options and post-surgical rehab. Pt is agreeable to discharge today.    PLAN:  OT FREQUENCY: 1x/week   OT DURATION: 4 weeks   PLANNED INTERVENTIONS: self care/ADL training, therapeutic exercise, therapeutic activity, manual therapy, passive range of motion, electrical stimulation, ultrasound, cryotherapy, patient/family education, and DME and/or AE instructions     CONSULTED AND AGREED WITH PLAN OF CARE: Patient   PLAN FOR NEXT SESSION: Discharge pt  Guadelupe Sabin, OTR/L  [phone] 10/12/2021, 9:39 AM     OCCUPATIONAL THERAPY DISCHARGE SUMMARY  Visits from Start of Care: 4  Current functional level related to goals / functional outcomes: See above. Pt has met 2 goals and partially met 2 additional goals. Pt reports he feels like he has just a little better function and pain level however largely the pain has remained the same. ROM and strength have improved.    Remaining deficits: Pain in the RUE   Education / Equipment: HEP for ROM and limited strengthening, modalities for pain management   Patient agrees to discharge. Patient goals were partially met. Patient is being discharged due to maximized rehab potential at this time.

## 2021-11-02 ENCOUNTER — Ambulatory Visit: Payer: Medicare HMO | Admitting: Orthopedic Surgery

## 2021-11-10 ENCOUNTER — Ambulatory Visit: Payer: Medicare HMO | Admitting: Orthopedic Surgery

## 2021-11-10 DIAGNOSIS — S46011D Strain of muscle(s) and tendon(s) of the rotator cuff of right shoulder, subsequent encounter: Secondary | ICD-10-CM

## 2021-11-10 NOTE — Progress Notes (Signed)
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  He cannot sleep on his right side he cannot sleep on his left side    He has a new associate come in in May he wants to wait till next year because of the long 66-monthrecovery that would be required after surgery  His exam shows he has full forward elevation but he has weakness when he reaches his arm away from his body and he has some pain at terminal flexion  Encounter Diagnosis  Name Primary?   Traumatic complete tear of right rotator cuff, subsequent encounter Yes    [place] ready [place] surgery or sooner if his pain increases

## 2021-11-16 DIAGNOSIS — H2511 Age-related nuclear cataract, right eye: Secondary | ICD-10-CM | POA: Diagnosis not present

## 2021-11-16 DIAGNOSIS — H25013 Cortical age-related cataract, bilateral: Secondary | ICD-10-CM | POA: Diagnosis not present

## 2021-11-16 DIAGNOSIS — H25043 Posterior subcapsular polar age-related cataract, bilateral: Secondary | ICD-10-CM | POA: Diagnosis not present

## 2021-11-16 DIAGNOSIS — H2513 Age-related nuclear cataract, bilateral: Secondary | ICD-10-CM | POA: Diagnosis not present

## 2021-11-16 DIAGNOSIS — H18413 Arcus senilis, bilateral: Secondary | ICD-10-CM | POA: Diagnosis not present

## 2022-01-19 DIAGNOSIS — G4733 Obstructive sleep apnea (adult) (pediatric): Secondary | ICD-10-CM | POA: Diagnosis not present

## 2022-02-08 DIAGNOSIS — L658 Other specified nonscarring hair loss: Secondary | ICD-10-CM | POA: Diagnosis not present

## 2022-02-08 DIAGNOSIS — R32 Unspecified urinary incontinence: Secondary | ICD-10-CM | POA: Diagnosis not present

## 2022-02-08 DIAGNOSIS — R5382 Chronic fatigue, unspecified: Secondary | ICD-10-CM | POA: Diagnosis not present

## 2022-02-08 DIAGNOSIS — R69 Illness, unspecified: Secondary | ICD-10-CM | POA: Diagnosis not present

## 2022-02-26 DIAGNOSIS — H2511 Age-related nuclear cataract, right eye: Secondary | ICD-10-CM | POA: Diagnosis not present

## 2022-02-27 DIAGNOSIS — H25012 Cortical age-related cataract, left eye: Secondary | ICD-10-CM | POA: Diagnosis not present

## 2022-02-27 DIAGNOSIS — H25042 Posterior subcapsular polar age-related cataract, left eye: Secondary | ICD-10-CM | POA: Diagnosis not present

## 2022-02-27 DIAGNOSIS — H2512 Age-related nuclear cataract, left eye: Secondary | ICD-10-CM | POA: Diagnosis not present

## 2022-03-09 DIAGNOSIS — J329 Chronic sinusitis, unspecified: Secondary | ICD-10-CM | POA: Diagnosis not present

## 2022-03-09 DIAGNOSIS — B001 Herpesviral vesicular dermatitis: Secondary | ICD-10-CM | POA: Diagnosis not present

## 2022-03-09 DIAGNOSIS — Z20828 Contact with and (suspected) exposure to other viral communicable diseases: Secondary | ICD-10-CM | POA: Diagnosis not present

## 2022-03-09 DIAGNOSIS — Z6829 Body mass index (BMI) 29.0-29.9, adult: Secondary | ICD-10-CM | POA: Diagnosis not present

## 2022-03-09 DIAGNOSIS — R0981 Nasal congestion: Secondary | ICD-10-CM | POA: Diagnosis not present

## 2022-03-09 DIAGNOSIS — E663 Overweight: Secondary | ICD-10-CM | POA: Diagnosis not present

## 2022-03-12 DIAGNOSIS — H2512 Age-related nuclear cataract, left eye: Secondary | ICD-10-CM | POA: Diagnosis not present

## 2022-03-12 DIAGNOSIS — H52202 Unspecified astigmatism, left eye: Secondary | ICD-10-CM | POA: Diagnosis not present

## 2022-04-05 ENCOUNTER — Other Ambulatory Visit: Payer: Self-pay

## 2022-04-05 ENCOUNTER — Emergency Department (HOSPITAL_COMMUNITY): Payer: Medicare HMO

## 2022-04-05 ENCOUNTER — Encounter: Payer: Self-pay | Admitting: Radiology

## 2022-04-05 ENCOUNTER — Encounter (HOSPITAL_COMMUNITY): Payer: Self-pay

## 2022-04-05 ENCOUNTER — Observation Stay (HOSPITAL_COMMUNITY)
Admission: EM | Admit: 2022-04-05 | Discharge: 2022-04-06 | Payer: Medicare HMO | Attending: Internal Medicine | Admitting: Internal Medicine

## 2022-04-05 DIAGNOSIS — G250 Essential tremor: Secondary | ICD-10-CM | POA: Diagnosis not present

## 2022-04-05 DIAGNOSIS — C9591 Leukemia, unspecified, in remission: Secondary | ICD-10-CM | POA: Diagnosis not present

## 2022-04-05 DIAGNOSIS — R0602 Shortness of breath: Secondary | ICD-10-CM | POA: Diagnosis not present

## 2022-04-05 DIAGNOSIS — R0600 Dyspnea, unspecified: Secondary | ICD-10-CM | POA: Diagnosis not present

## 2022-04-05 DIAGNOSIS — E538 Deficiency of other specified B group vitamins: Secondary | ICD-10-CM | POA: Diagnosis not present

## 2022-04-05 DIAGNOSIS — Z79899 Other long term (current) drug therapy: Secondary | ICD-10-CM | POA: Diagnosis not present

## 2022-04-05 DIAGNOSIS — F17290 Nicotine dependence, other tobacco product, uncomplicated: Secondary | ICD-10-CM | POA: Insufficient documentation

## 2022-04-05 DIAGNOSIS — I10 Essential (primary) hypertension: Secondary | ICD-10-CM | POA: Diagnosis not present

## 2022-04-05 DIAGNOSIS — Z1152 Encounter for screening for COVID-19: Secondary | ICD-10-CM | POA: Insufficient documentation

## 2022-04-05 DIAGNOSIS — D649 Anemia, unspecified: Secondary | ICD-10-CM | POA: Diagnosis not present

## 2022-04-05 DIAGNOSIS — G3184 Mild cognitive impairment, so stated: Secondary | ICD-10-CM | POA: Diagnosis present

## 2022-04-05 DIAGNOSIS — R42 Dizziness and giddiness: Secondary | ICD-10-CM | POA: Diagnosis not present

## 2022-04-05 DIAGNOSIS — D61818 Other pancytopenia: Secondary | ICD-10-CM | POA: Insufficient documentation

## 2022-04-05 DIAGNOSIS — E559 Vitamin D deficiency, unspecified: Secondary | ICD-10-CM | POA: Diagnosis not present

## 2022-04-05 DIAGNOSIS — D696 Thrombocytopenia, unspecified: Secondary | ICD-10-CM | POA: Diagnosis not present

## 2022-04-05 DIAGNOSIS — Z6829 Body mass index (BMI) 29.0-29.9, adult: Secondary | ICD-10-CM | POA: Diagnosis not present

## 2022-04-05 DIAGNOSIS — R69 Illness, unspecified: Secondary | ICD-10-CM | POA: Diagnosis not present

## 2022-04-05 DIAGNOSIS — R638 Other symptoms and signs concerning food and fluid intake: Secondary | ICD-10-CM | POA: Diagnosis not present

## 2022-04-05 DIAGNOSIS — E663 Overweight: Secondary | ICD-10-CM | POA: Diagnosis not present

## 2022-04-05 DIAGNOSIS — R5383 Other fatigue: Secondary | ICD-10-CM | POA: Diagnosis not present

## 2022-04-05 DIAGNOSIS — R7309 Other abnormal glucose: Secondary | ICD-10-CM | POA: Diagnosis not present

## 2022-04-05 DIAGNOSIS — R4182 Altered mental status, unspecified: Secondary | ICD-10-CM | POA: Diagnosis not present

## 2022-04-05 LAB — TROPONIN I (HIGH SENSITIVITY)
Troponin I (High Sensitivity): 11 ng/L (ref <18)
Troponin I (High Sensitivity): 8 ng/L (ref <18)

## 2022-04-05 LAB — BRAIN NATRIURETIC PEPTIDE: B Natriuretic Peptide: 754.3 pg/mL — ABNORMAL HIGH (ref 0.0–100.0)

## 2022-04-05 LAB — VITAMIN B12: Vitamin B-12: 7414 pg/mL — ABNORMAL HIGH (ref 180–914)

## 2022-04-05 LAB — CBC
HCT: 12.5 % — ABNORMAL LOW (ref 39.0–52.0)
Hemoglobin: 4 g/dL — CL (ref 13.0–17.0)
MCH: 37 pg — ABNORMAL HIGH (ref 26.0–34.0)
MCHC: 32 g/dL (ref 30.0–36.0)
MCV: 115.7 fL — ABNORMAL HIGH (ref 80.0–100.0)
Platelets: 55 10*3/uL — ABNORMAL LOW (ref 150–400)
RBC: 1.08 MIL/uL — ABNORMAL LOW (ref 4.22–5.81)
RDW: 17.6 % — ABNORMAL HIGH (ref 11.5–15.5)
WBC: 5.7 10*3/uL (ref 4.0–10.5)
nRBC: 0.7 % — ABNORMAL HIGH (ref 0.0–0.2)

## 2022-04-05 LAB — HIV ANTIBODY (ROUTINE TESTING W REFLEX): HIV Screen 4th Generation wRfx: NONREACTIVE

## 2022-04-05 LAB — CBC WITH DIFFERENTIAL/PLATELET
Abs Immature Granulocytes: 0 10*3/uL (ref 0.00–0.07)
Basophils Absolute: 0 10*3/uL (ref 0.0–0.1)
Basophils Relative: 0 %
Eosinophils Absolute: 0 10*3/uL (ref 0.0–0.5)
Eosinophils Relative: 0 %
HCT: 15.1 % — ABNORMAL LOW (ref 39.0–52.0)
Hemoglobin: 5.3 g/dL — CL (ref 13.0–17.0)
Lymphocytes Relative: 35 %
Lymphs Abs: 2.2 10*3/uL (ref 0.7–4.0)
MCH: 36.6 pg — ABNORMAL HIGH (ref 26.0–34.0)
MCHC: 35.1 g/dL (ref 30.0–36.0)
MCV: 104.1 fL — ABNORMAL HIGH (ref 80.0–100.0)
Monocytes Absolute: 0 10*3/uL — ABNORMAL LOW (ref 0.1–1.0)
Monocytes Relative: 0 %
Neutro Abs: 0 10*3/uL — CL (ref 1.7–7.7)
Neutrophils Relative %: 0 %
Other: 65 %
Platelets: 50 10*3/uL — ABNORMAL LOW (ref 150–400)
RBC: 1.45 MIL/uL — ABNORMAL LOW (ref 4.22–5.81)
RDW: 19.8 % — ABNORMAL HIGH (ref 11.5–15.5)
Smear Review: DECREASED
WBC: 6.4 10*3/uL (ref 4.0–10.5)
nRBC: 0.6 % — ABNORMAL HIGH (ref 0.0–0.2)

## 2022-04-05 LAB — RETICULOCYTES
Immature Retic Fract: 20.8 % — ABNORMAL HIGH (ref 2.3–15.9)
RBC.: 1.48 MIL/uL — ABNORMAL LOW (ref 4.22–5.81)
Retic Count, Absolute: 21.8 10*3/uL (ref 19.0–186.0)
Retic Ct Pct: 1.5 % (ref 0.4–3.1)

## 2022-04-05 LAB — IRON AND TIBC
Iron: 188 ug/dL — ABNORMAL HIGH (ref 45–182)
Saturation Ratios: 86 % — ABNORMAL HIGH (ref 17.9–39.5)
TIBC: 218 ug/dL — ABNORMAL LOW (ref 250–450)
UIBC: 30 ug/dL

## 2022-04-05 LAB — POC OCCULT BLOOD, ED: Fecal Occult Bld: NEGATIVE

## 2022-04-05 LAB — BASIC METABOLIC PANEL
Anion gap: 9 (ref 5–15)
BUN: 19 mg/dL (ref 8–23)
CO2: 22 mmol/L (ref 22–32)
Calcium: 8.5 mg/dL — ABNORMAL LOW (ref 8.9–10.3)
Chloride: 109 mmol/L (ref 98–111)
Creatinine, Ser: 1.21 mg/dL (ref 0.61–1.24)
GFR, Estimated: >60 mL/min (ref >60)
Glucose, Bld: 119 mg/dL — ABNORMAL HIGH (ref 70–99)
Potassium: 3.9 mmol/L (ref 3.5–5.1)
Sodium: 140 mmol/L (ref 135–145)

## 2022-04-05 LAB — ABO/RH: ABO/RH(D): O POS

## 2022-04-05 LAB — RESP PANEL BY RT-PCR (RSV, FLU A&B, COVID)  RVPGX2
Influenza A by PCR: NEGATIVE
Influenza B by PCR: NEGATIVE
Resp Syncytial Virus by PCR: NEGATIVE
SARS Coronavirus 2 by RT PCR: NEGATIVE

## 2022-04-05 LAB — FOLATE: Folate: 5.3 ng/mL — ABNORMAL LOW (ref >5.9)

## 2022-04-05 LAB — PREPARE RBC (CROSSMATCH)

## 2022-04-05 LAB — FERRITIN: Ferritin: 1802 ng/mL — ABNORMAL HIGH (ref 24–336)

## 2022-04-05 MED ORDER — SODIUM CHLORIDE 0.9% FLUSH
3.0000 mL | Freq: Two times a day (BID) | INTRAVENOUS | Status: DC
  Administered 2022-04-06 (×2): 3 mL via INTRAVENOUS

## 2022-04-05 MED ORDER — ACETAMINOPHEN 325 MG PO TABS: 650.0000 mg | ORAL_TABLET | Freq: Four times a day (QID) | ORAL | Status: DC | PRN

## 2022-04-05 MED ORDER — POLYETHYLENE GLYCOL 3350 17 G PO PACK: 17.0000 g | PACK | Freq: Every day | ORAL | Status: DC | PRN

## 2022-04-05 MED ORDER — ORAL CARE MOUTH RINSE: 15.0000 mL | OROMUCOSAL | Status: DC | PRN

## 2022-04-05 MED ORDER — ACETAMINOPHEN 650 MG RE SUPP: 650.0000 mg | Freq: Four times a day (QID) | RECTAL | Status: DC | PRN

## 2022-04-05 MED ORDER — MEMANTINE HCL 10 MG PO TABS
10.0000 mg | ORAL_TABLET | Freq: Two times a day (BID) | ORAL | Status: DC
  Administered 2022-04-06 (×3): 10 mg via ORAL
  Filled 2022-04-05 (×4): qty 1

## 2022-04-05 MED ORDER — SODIUM CHLORIDE 0.9% IV SOLUTION: Freq: Once | INTRAVENOUS | Status: AC

## 2022-04-05 MED ORDER — PROPRANOLOL HCL 20 MG PO TABS
20.0000 mg | ORAL_TABLET | Freq: Three times a day (TID) | ORAL | Status: DC
  Administered 2022-04-06 (×2): 20 mg via ORAL
  Filled 2022-04-05 (×2): qty 1

## 2022-04-05 MED ORDER — SERTRALINE HCL 50 MG PO TABS: 150.0000 mg | ORAL_TABLET | Freq: Every day | ORAL | Status: DC

## 2022-04-05 MED ORDER — DONEPEZIL HCL 10 MG PO TABS
10.0000 mg | ORAL_TABLET | Freq: Every day | ORAL | Status: DC
  Administered 2022-04-06: 10 mg via ORAL
  Filled 2022-04-05: qty 1

## 2022-04-05 NOTE — Plan of Care (Signed)

## 2022-04-05 NOTE — ED Provider Notes (Signed)
[facility] Provider Note   CSN: [id number] Arrival date & time: 04/05/22  1243     History  Chief Complaint  Patient presents with   Dizziness    Christopher Lowery is a 72 y.o. male.  The history is provided by the patient, the spouse and medical records. No language interpreter was used.  Dizziness Quality:  Lightheadedness Severity:  Moderate Onset quality:  Gradual Duration:  3 weeks Timing:  Constant Progression:  Waxing and waning Chronicity:  New Context comment:  Exertion Relieved by:  Nothing Ineffective treatments:  None tried Associated symptoms: shortness of breath   Associated symptoms: no chest pain, no diarrhea, no headaches, no nausea, no palpitations, no syncope, no vision changes, no vomiting and no weakness        Home Medications Prior to Admission medications   Medication Sig Start Date End Date Taking? Authorizing Provider  amLODipine-olmesartan (AZOR) 5-20 MG tablet Take 1 tablet by mouth daily.    [provider]  donepezil (ARICEPT) 10 MG tablet Take 1 tablet (10 mg total) by mouth at bedtime. 04/27/21   Marcial Pacas, MD  memantine (NAMENDA) 10 MG tablet Take 1 tablet (10 mg total) by mouth 2 (two) times daily. 04/27/21   Marcial Pacas, MD  propranolol (INDERAL) 10 MG tablet Take 2 tablets (20 mg total) by mouth 3 (three) times daily. 10/20/20   Marcial Pacas, MD  sertraline (ZOLOFT) 100 MG tablet Take 150 mg by mouth daily.    [provider]  testosterone cypionate (DEPOTESTOSTERONE CYPIONATE) 200 MG/ML injection Inject 200 mg into the muscle once a week. 08/15/21   [provider]  Vitamin D, Ergocalciferol, (DRISDOL) 1.25 MG (50000 UNIT) CAPS capsule Take 50,000 Units by mouth once a week. 08/14/21   [provider]  vitamin E 200 UNIT capsule Take 200 Units by mouth daily.    [provider]  zolpidem (AMBIEN) 10 MG tablet Take 5 mg by mouth at bedtime as needed for  sleep.    [provider]      Allergies    Patient has no known allergies.    Review of Systems   Review of Systems  Constitutional:  Positive for fatigue. Negative for chills, diaphoresis and fever.  HENT:  Negative for congestion.   Eyes:  Negative for visual disturbance.  Respiratory:  Positive for shortness of breath. Negative for cough, chest tightness and wheezing.   Cardiovascular:  Positive for leg swelling. Negative for chest pain, palpitations and syncope.  Gastrointestinal:  Negative for abdominal pain, constipation, diarrhea, nausea and vomiting.  Genitourinary:  Negative for dysuria.  Musculoskeletal:  Negative for back pain, neck pain and neck stiffness.  Skin:  Positive for pallor. Negative for rash and wound.  Neurological:  Positive for light-headedness. Negative for dizziness, syncope, facial asymmetry, weakness and headaches.  Psychiatric/Behavioral:  Negative for agitation and confusion.   All other systems reviewed and are negative.   Physical Exam Updated Vital Signs BP 114/72 (BP Location: Right Arm)   Pulse (!) 52   Temp 98.2 F (36.8 C) (Oral)   Resp 20   Ht '5\' 8"'$  (1.727 m)   Wt 99.8 kg   SpO2 100%   BMI 33.45 kg/m  Physical Exam Vitals and nursing note reviewed.  Constitutional:      General: He is not in acute distress.    Appearance: He is well-developed. He is not ill-appearing, toxic-appearing or diaphoretic.  HENT:  Head: Normocephalic and atraumatic.     Nose: Nose normal.     Mouth/Throat:     Mouth: Mucous membranes are moist.     Pharynx: No oropharyngeal exudate or posterior oropharyngeal erythema.  Eyes:     Extraocular Movements: Extraocular movements intact.     Conjunctiva/sclera: Conjunctivae normal.     Pupils: Pupils are equal, round, and reactive to light.  Neck:     Vascular: No carotid bruit.  Cardiovascular:     Rate and Rhythm: Normal rate and regular rhythm.     Heart sounds: No murmur  heard. Pulmonary:     Effort: Pulmonary effort is normal. No respiratory distress.     Breath sounds: Normal breath sounds. No wheezing or rhonchi.  Chest:     Chest wall: No tenderness.  Abdominal:     General: Abdomen is flat. There is no distension.     Palpations: Abdomen is soft.     Tenderness: There is no abdominal tenderness. There is no guarding or rebound.  Genitourinary:    Rectum: Guaiac result negative.  Musculoskeletal:        General: No swelling or tenderness.     Cervical back: Neck supple. No tenderness.     Right lower leg: Edema present.     Left lower leg: Edema present.  Skin:    General: Skin is warm and dry.     Capillary Refill: Capillary refill takes less than 2 seconds.     Coloration: Skin is pale.     Findings: No erythema or rash.  Neurological:     General: No focal deficit present.     Mental Status: He is alert.     Sensory: No sensory deficit.     Motor: No weakness.  Psychiatric:        Mood and Affect: Mood normal.     ED Results / Procedures / Treatments   Labs (all labs ordered are listed, but only abnormal results are displayed) Labs Reviewed  BASIC METABOLIC PANEL - Abnormal; Notable for the following components:      Result Value   Glucose, Bld 119 (*)    Calcium 8.5 (*)    All other components within normal limits  CBC - Abnormal; Notable for the following components:   RBC 1.08 (*)    Hemoglobin 4.0 (*)    HCT 12.5 (*)    MCV 115.7 (*)    MCH 37.0 (*)    RDW 17.6 (*)    Platelets 55 (*)    nRBC 0.7 (*)    All other components within normal limits  BRAIN NATRIURETIC PEPTIDE - Abnormal; Notable for the following components:   B Natriuretic Peptide 754.3 (*)    All other components within normal limits  RESP PANEL BY RT-PCR (RSV, FLU A&B, COVID)  [id number]  OCCULT BLOOD X 1 CARD TO LAB, STOOL  BRAIN NATRIURETIC PEPTIDE  HIV ANTIBODY (ROUTINE TESTING W REFLEX)  VITAMIN B12  FOLATE  IRON AND TIBC  FERRITIN  RETICULOCYTES   TECHNOLOGIST SMEAR REVIEW  POC OCCULT BLOOD, ED  TYPE AND SCREEN  ABO/RH  PREPARE RBC (CROSSMATCH)  TROPONIN I (HIGH SENSITIVITY)  TROPONIN I (HIGH SENSITIVITY)    EKG EKG Interpretation  Date/Time:  Thursday April 05 2022 12:51:23 EST Ventricular Rate:  63 PR Interval:  176 QRS Duration: 80 QT Interval:  462 QTC Calculation: 472 R Axis:   42 Text Interpretation: Sinus rhythm with frequent Premature ventricular complexes Otherwise normal ECG No previous  ECGs available no prior ECG for comparison. No STEMI Confirmed by Antony Blackbird [id number] on 04/05/2022 2:39:43 PM  Radiology DG Chest 2 View  Result Date: 04/05/2022 CLINICAL DATA:  Dyspnea with exertion. EXAM: CHEST - 2 VIEW COMPARISON:  None Available. FINDINGS: Mild cardiomegaly. Both lungs are clear. The visualized skeletal structures are unremarkable. IMPRESSION: No active cardiopulmonary disease. Electronically Signed   By: Marijo Conception M.D.   On: 04/05/2022 13:15    Procedures Procedures    CRITICAL CARE Performed by: Gwenyth Allegra Denver Bentson Total critical care time: 35 minutes Critical care time was exclusive of separately billable procedures and treating other patients. Critical care was necessary to treat or prevent imminent or life-threatening deterioration. Critical care was time spent personally by me on the following activities: development of treatment plan with patient and/or surrogate as well as nursing, discussions with consultants, evaluation of patient's response to treatment, examination of patient, obtaining history from patient or surrogate, ordering and performing treatments and interventions, ordering and review of laboratory studies, ordering and review of radiographic studies, pulse oximetry and re-evaluation of patient's condition.  Medications Ordered in ED Medications - No data to display  ED Course/ Medical Decision Making/ A&P                             Medical Decision Making Amount  and/or Complexity of Data Reviewed Labs: ordered. Radiology: ordered.  Risk Prescription drug management. Decision regarding hospitalization.    Christopher Lowery is a 72 y.o. male with past medical history significant for anxiety and hypertension presents with several weeks of worsening exertional shortness of breath, lightheadedness, fatigue, near syncope, palpitations, and pallor.  He reports she has had some dark stools but denies history of GI bleed.  He reports has had some nasal congestion and bloody drainage that comes and goes.  He denies any chest pain.  Denies any nausea or vomiting.  Denies any diarrhea.  Denies any urinary changes.  Has had no energy and fatigued and presents for evaluation.  Denies fevers, chills, congestion, or significant cough.  On exam, lungs clear.  Chest nontender.  Abdomen nontender.  Very pale mucous membranes and conjunctiva.  Fecal occult test was negative.  Abdomen nontender.  Patient not tachycardic or tachypneic.  He is not hypotensive.  Oxygen saturations are normal on room air.  Patient had workup starting in triage including a hemoglobin that was found to be 4.0.  Fecal occult test negative.  Unclear cause of the patient's symptomatic anemia however his platelets are also low.  Ordered 2 units of blood and call for admission.  Otherwise metabolic panel showed creatinine of 1.2 and electrolytes similar to prior.  Troponin negative.  Due to edema in legs will get a BNP.  BNP returned elevated at 754, anticipate he will likely need an echo.  Patient admitted to medicine for further management of symptomatic anemia with a hemoglobin of 4.0 with negative fecal occult test.  Patient admitted for further management.         Final Clinical Impression(s) / ED Diagnoses Final diagnoses:  Symptomatic anemia     Clinical Impression: 1. Symptomatic anemia     Disposition: Admit  This note was prepared with assistance of Dragon voice recognition  software. Occasional wrong-word or sound-a-like substitutions may have occurred due to the inherent limitations of voice recognition software.     Cameran Ahmed, Gwenyth Allegra, MD 04/05/22 [phone]

## 2022-04-05 NOTE — ED Provider Triage Note (Signed)
Emergency Medicine Provider Triage Evaluation Note  Christopher Lowery , a 72 y.o. male  was evaluated in triage.  Pt complains of weakness and dizziness.  Patient reports has been feeling generally rundown for the last several weeks.  Reports he has been having some respiratory symptoms including congestion, facial pressure, low-grade fevers.  Coughing.  Denies any prior cardiac history.  Not currently experiencing any chest pain, nausea, vomiting, diarrhea.  Denies any known sick contacts.  Review of Systems  Positive: As above Negative: As above  Physical Exam  BP 114/72 (BP Location: Right Arm)   Pulse (!) 52   Temp 98.2 F (36.8 C) (Oral)   Resp 20   Ht '5\' 8"'$  (1.727 m)   Wt 99.8 kg   SpO2 100%   BMI 33.45 kg/m  Gen:   Awake, no distress   Resp:  Normal effort  MSK:   Moves extremities without difficulty  Other:    Medical Decision Making  Medically screening exam initiated at 1:52 PM.  Appropriate orders placed.  Ladell Pier was informed that the remainder of the evaluation will be completed by another provider, this initial triage assessment does not replace that evaluation, and the importance of remaining in the ED until their evaluation is complete.     Luvenia Heller, PA-C 04/05/22 1353

## 2022-04-05 NOTE — Progress Notes (Signed)
Lab called to report hemoglobin of 5.3 following 1st transfusion and 65% immature cells and no neutrophils. Attending Dr. Sidney Ace notified via secure chat. Patient placed on neutropenic precautions.

## 2022-04-05 NOTE — ED Triage Notes (Addendum)
Pt c/o lightheadedness, dizziness, palpitation 3 wks ago and has gotten worse in the last couple of days. Pt c/o SOB w/exertionx1wk. Pt looks pale

## 2022-04-05 NOTE — ED Notes (Signed)
ED TO INPATIENT HANDOFF REPORT  ED Nurse Name and Phone #:  Vikki Ports [phone]  S Name/Age/Gender Christopher Lowery 72 y.o. male Room/Bed: 017C/017C  Code Status   Code Status: Full Code  Home/SNF/Other Home Patient oriented to: self, place, time, and situation Is this baseline? Yes   Triage Complete: Triage complete  Chief Complaint Symptomatic anemia [D64.9]  Triage Note Pt c/o lightheadedness, dizziness, palpitation 3 wks ago and has gotten worse in the last couple of days. Pt c/o SOB w/exertionx1wk. Pt looks pale   Allergies No Known Allergies  Level of Care/Admitting Diagnosis ED Disposition     ED Disposition  Admit   Condition  --   Comment  Hospital Area: [facility] [[id number]]  Level of Care: Telemetry Medical [104]  May place patient in observation at [facility] or [facility] if equivalent level of care is available:: No  Covid Evaluation: Asymptomatic - no recent exposure (last 10 days) testing not required  Diagnosis: Symptomatic anemia [id number]  Admitting Physician: Marcelyn Bruins [id number]  Attending Physician: Marcelyn Bruins [id number]          B Medical/Surgery History Past Medical History:  Diagnosis Date   Anxiety    Hypertension    Past Surgical History:  Procedure Laterality Date   CATARACT EXTRACTION Bilateral    COLONOSCOPY N/A 09/18/2013   Procedure: COLONOSCOPY;  Surgeon: Jamesetta So, MD;  Location: [facility];  Service: Gastroenterology;  Laterality: N/A;     A IV Location/Drains/Wounds Patient Lines/Drains/Airways Status     Active Line/Drains/Airways     Name Placement date Placement time Site Days   Peripheral IV 04/05/22 20 G Left Antecubital 04/05/22  1431  Antecubital  less than 1            Intake/Output Last 24 hours No intake or output data in the 24 hours ending 04/05/22 1649  Labs/Imaging Results for orders placed or performed during the hospital encounter of 04/05/22  (from the past 48 hour(s))  Basic metabolic panel     Status: Abnormal   Collection Time: 04/05/22  1:00 PM  Result Value Ref Range   Sodium 140 135 - 145 mmol/L   Potassium 3.9 3.5 - 5.1 mmol/L   Chloride 109 98 - 111 mmol/L   CO2 22 22 - 32 mmol/L   Glucose, Bld 119 (H) 70 - 99 mg/dL    Comment: Glucose reference range applies only to samples taken after fasting for at least 8 hours.   BUN 19 8 - 23 mg/dL   Creatinine, Ser 1.21 0.61 - 1.24 mg/dL   Calcium 8.5 (L) 8.9 - 10.3 mg/dL   GFR, Estimated >60 >60 mL/min    Comment: (NOTE) Calculated using the CKD-EPI Creatinine Equation (2021)    Anion gap 9 5 - 15    Comment: Performed at [facility] [address], [place], [place] [address]  CBC     Status: Abnormal   Collection Time: 04/05/22  1:00 PM  Result Value Ref Range   WBC 5.7 4.0 - 10.5 K/uL   RBC 1.08 (L) 4.22 - 5.81 MIL/uL   Hemoglobin 4.0 (LL) 13.0 - 17.0 g/dL    Comment: REPEATED TO VERIFY THIS CRITICAL RESULT HAS VERIFIED AND BEEN CALLED TO A. BANKS, RN BY DAISY BLU ON 02 29 2024 AT 1404, AND HAS BEEN READ BACK.     HCT 12.5 (L) 39.0 - 52.0 %   MCV 115.7 (H) 80.0 - 100.0  fL   MCH 37.0 (H) 26.0 - 34.0 pg   MCHC 32.0 30.0 - 36.0 g/dL   RDW 17.6 (H) 11.5 - 15.5 %   Platelets 55 (L) 150 - 400 K/uL    Comment: Immature Platelet Fraction may be clinically indicated, consider ordering this additional test [id number] REPEATED TO VERIFY    nRBC 0.7 (H) 0.0 - 0.2 %    Comment: Performed at [facility] [address], [place], [place] [address]  Troponin I (High Sensitivity)     Status: None   Collection Time: 04/05/22  1:00 PM  Result Value Ref Range   Troponin I (High Sensitivity) 11 <18 ng/L    Comment: (NOTE) Elevated high sensitivity troponin I (hsTnI) values and significant  changes across serial measurements may suggest ACS but many other  chronic and acute conditions are known to elevate hsTnI results.  Refer to the "Links" section for chest  pain algorithms and additional  guidance. Performed at [facility], [place] [address], [place], [place] [address]   ABO/Rh     Status: None   Collection Time: 04/05/22  1:00 PM  Result Value Ref Range   ABO/RH(D)      O POS Performed at [facility] [address], [place], [place] [address]   Resp panel by RT-PCR (RSV, Flu A&B, Covid) Anterior Nasal Swab     Status: None   Collection Time: 04/05/22  1:54 PM   Specimen: Anterior Nasal Swab  Result Value Ref Range   SARS Coronavirus 2 by RT PCR NEGATIVE NEGATIVE   Influenza A by PCR NEGATIVE NEGATIVE   Influenza B by PCR NEGATIVE NEGATIVE    Comment: (NOTE) The Xpert Xpress SARS-CoV-2/FLU/RSV plus assay is intended as an aid in the diagnosis of influenza from Nasopharyngeal swab specimens and should not be used as a sole basis for treatment. Nasal washings and aspirates are unacceptable for Xpert Xpress SARS-CoV-2/FLU/RSV testing.  Fact Sheet for Patients: EntrepreneurPulse.com.au  Fact Sheet for Healthcare Providers: IncredibleEmployment.be  This test is not yet approved or cleared by the [place] FDA and has been authorized for detection and/or diagnosis of SARS-CoV-2 by FDA under an Emergency Use Authorization (EUA). This EUA will remain in effect (meaning this test can be used) for the duration of the COVID-19 declaration under Section 564(b)(1) of the Act, 21 U.S.C. section 360bbb-3(b)(1), unless the authorization is terminated or revoked.     Resp Syncytial Virus by PCR NEGATIVE NEGATIVE    Comment: (NOTE) Fact Sheet for Patients: EntrepreneurPulse.com.au  Fact Sheet for Healthcare Providers: IncredibleEmployment.be  This test is not yet approved or cleared by the [place] FDA and has been authorized for detection and/or diagnosis of SARS-CoV-2 by FDA under an Emergency Use Authorization (EUA). This EUA will remain in  effect (meaning this test can be used) for the duration of the COVID-19 declaration under Section 564(b)(1) of the Act, 21 U.S.C. section 360bbb-3(b)(1), unless the authorization is terminated or revoked.  Performed at [facility], [place] [address] [place], [place] [address]   Type and screen [facility]     Status: None (Preliminary result)   Collection Time: 04/05/22  2:34 PM  Result Value Ref Range   ABO/RH(D) O POS    Antibody Screen NEG    Sample Expiration 04/08/2022,2359    Unit Number [id number]    Blood Component Type RED CELLS,LR    Unit division 00    Status of  Unit ISSUED    Transfusion Status OK TO TRANSFUSE    Crossmatch Result      Compatible Performed at [facility], [place] [address], [place], [place] [address]    Unit Number [id number]    Blood Component Type RED CELLS,LR    Unit division 00    Status of Unit ALLOCATED    Transfusion Status OK TO TRANSFUSE    Crossmatch Result Compatible   Prepare RBC (crossmatch)     Status: None   Collection Time: 04/05/22  2:39 PM  Result Value Ref Range   Order Confirmation      ORDER PROCESSED BY BLOOD BANK Performed at [facility], [place] [address] [place], [place] [address]   POC occult blood, ED     Status: None   Collection Time: 04/05/22  2:51 PM  Result Value Ref Range   Fecal Occult Bld NEGATIVE NEGATIVE  Troponin I (High Sensitivity)     Status: None   Collection Time: 04/05/22  2:54 PM  Result Value Ref Range   Troponin I (High Sensitivity) 8 <18 ng/L    Comment: (NOTE) Elevated high sensitivity troponin I (hsTnI) values and significant  changes across serial measurements may suggest ACS but many other  chronic and acute conditions are known to elevate hsTnI results.  Refer to the "Links" section for chest pain algorithms and additional  guidance. Performed at [facility], [place] [address], [place], [place] [address]   Brain natriuretic peptide     Status:  Abnormal   Collection Time: 04/05/22  2:55 PM  Result Value Ref Range   B Natriuretic Peptide 754.3 (H) 0.0 - 100.0 pg/mL    Comment: Performed at [facility] [address], [place], [place] [address]   DG Chest 2 View  Result Date: 04/05/2022 CLINICAL DATA:  Dyspnea with exertion. EXAM: CHEST - 2 VIEW COMPARISON:  None Available. FINDINGS: Mild cardiomegaly. Both lungs are clear. The visualized skeletal structures are unremarkable. IMPRESSION: No active cardiopulmonary disease. Electronically Signed   By: Marijo Conception M.D.   On: 04/05/2022 13:15    Pending Labs Unresulted Labs (From admission, onward)     Start     Ordered   04/06/22 0500  Comprehensive metabolic panel  Tomorrow morning,   R        04/05/22 1531   04/06/22 0500  CBC  Tomorrow morning,   R        04/05/22 1531   04/05/22 1531  Iron and TIBC  Add-on,   AD        04/05/22 1531   04/05/22 1531  Ferritin  Add-on,   AD        04/05/22 1531   04/05/22 1531  Reticulocytes  Add-on,   AD        04/05/22 1531   04/05/22 1531  Technologist smear review  (Technologist smear review)  Add-on,   AD       Question:  Clinical information:  Answer:  Symptomatic Macrocytic Anemia, Thrombocytopenia, unclear etiology   04/05/22 1531   04/05/22 1530  Vitamin B12  Add-on,   AD        04/05/22 1531   04/05/22 1530  Folate  Add-on,   AD        04/05/22 1531   04/05/22 1527  HIV Antibody (routine testing w rflx)  (HIV Antibody (Routine testing w reflex) panel)  Once,   R  04/05/22 1531   04/05/22 1448  Brain natriuretic peptide  Once,   URGENT        04/05/22 1447   04/05/22 1406  Occult blood card to lab, stool  Once,   URGENT       Question:  Release to patient  Answer:  Immediate   04/05/22 1405            Vitals/Pain Today's Vitals   04/05/22 1253 04/05/22 1550 04/05/22 1558 04/05/22 1613  BP: 114/72 (!) 108/57 (!) 108/57 107/68  Pulse: (!) 52 66 66 63  Resp: '20 17 17 16  '$ Temp: 98.2 F (36.8 C) 97.6 F  (36.4 C) 97.6 F (36.4 C) (!) 97.4 F (36.3 C)  TempSrc: Oral Oral Oral Oral  SpO2: 100%  100% 100%  Weight:      Height:      PainSc:        Isolation Precautions No active isolations  Medications Medications  sodium chloride flush (NS) 0.9 % injection 3 mL (has no administration in time range)  acetaminophen (TYLENOL) tablet 650 mg (has no administration in time range)    Or  acetaminophen (TYLENOL) suppository 650 mg (has no administration in time range)  polyethylene glycol (MIRALAX / GLYCOLAX) packet 17 g (has no administration in time range)  0.9 %  sodium chloride infusion (Manually program via Guardrails IV Fluids) ( Intravenous New Bag/Given 04/05/22 1558)    Mobility walks     Focused Assessments Cardiac Assessment Handoff:    No results found for: "CKTOTAL", "CKMB", "CKMBINDEX", "TROPONINI" No results found for: "DDIMER" Does the Patient currently have chest pain? No    R Recommendations: See Admitting Provider Note  Report given to:   Additional Notes: Pt receiving first unit started @ 1558. Needs 2nd unit when this one is complete

## 2022-04-05 NOTE — H&P (Signed)
History and Physical   Christopher Lowery [id number] DOB: [birth year] DOA: 04/05/2022  PCP: Sharilyn Sites, MD   Patient coming from: Home  Chief Complaint: Lightheadedness, dizziness, dyspnea on exertion  HPI: Christopher Lowery is a 72 y.o. male with medical history significant of mild cognitive impairment, essential tremor, anxiety, hypertension presenting with multiple symptoms including dyspnea on exertion, lightheadedness, dizziness.  Reports symptoms ongoing for about 3 weeks but has been significantly worse for the past couple days.  Also reports some dark stools but no prior history of GI bleed.  Reporting nasal congestion with intermittent bloody drainage.  Denies fevers, chills, chest pain, abdominal pain, constipation, diarrhea, nausea, vomiting.  ED Course: Vital signs in the ED notable for heart rate in the 50s.  Lab workup included BMP with glucose 119, calcium 8.5.  CBC with hemoglobin of 4 and platelets of 55.  Troponin negative with repeat pending.  FOBT negative.  BMP pending.  Respiratory panel for flu COVID RSV pending.  Patient typed and screened in the ED.  Chest x-ray showed no acute abnormality.  2 units ordered for transfusion in the ED.  Review of Systems: As per HPI otherwise all other systems reviewed and are negative.  Past Medical History:  Diagnosis Date   Anxiety    Hypertension     Past Surgical History:  Procedure Laterality Date   CATARACT EXTRACTION Bilateral    COLONOSCOPY N/A 09/18/2013   Procedure: COLONOSCOPY;  Surgeon: Jamesetta So, MD;  Location: [facility];  Service: Gastroenterology;  Laterality: N/A;    Social History  reports that he has been smoking cigars. He does not have any smokeless tobacco history on file. He reports current alcohol use of about 4.0 standard drinks of alcohol per week. He reports that he does not use drugs.  No Known Allergies  History reviewed. No pertinent family history.  Prior to Admission medications    Medication Sig Start Date End Date Taking? Authorizing Provider  amLODipine-olmesartan (AZOR) 5-20 MG tablet Take 1 tablet by mouth daily.    [provider]  donepezil (ARICEPT) 10 MG tablet Take 1 tablet (10 mg total) by mouth at bedtime. 04/27/21   Marcial Pacas, MD  memantine (NAMENDA) 10 MG tablet Take 1 tablet (10 mg total) by mouth 2 (two) times daily. 04/27/21   Marcial Pacas, MD  propranolol (INDERAL) 10 MG tablet Take 2 tablets (20 mg total) by mouth 3 (three) times daily. 10/20/20   Marcial Pacas, MD  sertraline (ZOLOFT) 100 MG tablet Take 150 mg by mouth daily.    [provider]  testosterone cypionate (DEPOTESTOSTERONE CYPIONATE) 200 MG/ML injection Inject 200 mg into the muscle once a week. 08/15/21   [provider]  Vitamin D, Ergocalciferol, (DRISDOL) 1.25 MG (50000 UNIT) CAPS capsule Take 50,000 Units by mouth once a week. 08/14/21   [provider]  vitamin E 200 UNIT capsule Take 200 Units by mouth daily.    [provider]  zolpidem (AMBIEN) 10 MG tablet Take 5 mg by mouth at bedtime as needed for sleep.    [provider]    Physical Exam: Vitals:   04/05/22 1253 04/05/22 1550 04/05/22 1558 04/05/22 1613  BP: 114/72 (!) 108/57 (!) 108/57 107/68  Pulse: (!) 52 66 66 63  Resp: '20 17 17 16  '$ Temp: 98.2 F (36.8 C) 97.6 F (36.4 C) 97.6 F (36.4 C) (!) 97.4 F (36.3 C)  TempSrc: Oral Oral Oral Oral  SpO2: 100%  100% 100%  Weight:      Height:        Physical Exam Constitutional:      General: He is not in acute distress.    Appearance: Normal appearance.  HENT:     Head: Normocephalic and atraumatic.     Mouth/Throat:     Mouth: Mucous membranes are moist.     Pharynx: Oropharynx is clear.  Eyes:     Extraocular Movements: Extraocular movements intact.     Pupils: Pupils are equal, round, and reactive to light.  Cardiovascular:     Rate and Rhythm: Normal rate. Rhythm irregular.     Pulses: Normal pulses.      Heart sounds: Normal heart sounds.  Pulmonary:     Effort: Pulmonary effort is normal. No respiratory distress.     Breath sounds: Normal breath sounds.  Abdominal:     General: Bowel sounds are normal. There is no distension.     Palpations: Abdomen is soft.     Tenderness: There is no abdominal tenderness.  Musculoskeletal:        General: No swelling or deformity.  Skin:    General: Skin is warm and dry.  Neurological:     General: No focal deficit present.     Mental Status: Mental status is at baseline.    Labs on Admission: I have personally reviewed following labs and imaging studies  CBC: Recent Labs  Lab 04/05/22 1300  WBC 5.7  HGB 4.0*  HCT 12.5*  MCV 115.7*  PLT 55*    Basic Metabolic Panel: Recent Labs  Lab 04/05/22 1300  NA 140  K 3.9  CL 109  CO2 22  GLUCOSE 119*  BUN 19  CREATININE 1.21  CALCIUM 8.5*    GFR: Estimated Creatinine Clearance: 65.1 mL/min (by C-G formula based on SCr of 1.21 mg/dL).  Liver Function Tests: No results for input(s): "AST", "ALT", "ALKPHOS", "BILITOT", "PROT", "ALBUMIN" in the last 168 hours.  Urine analysis: No results found for: "COLORURINE", "APPEARANCEUR", "LABSPEC", "PHURINE", "GLUCOSEU", "HGBUR", "BILIRUBINUR", "KETONESUR", "PROTEINUR", "UROBILINOGEN", "NITRITE", "LEUKOCYTESUR"  Radiological Exams on Admission: DG Chest 2 View  Result Date: 04/05/2022 CLINICAL DATA:  Dyspnea with exertion. EXAM: CHEST - 2 VIEW COMPARISON:  None Available. FINDINGS: Mild cardiomegaly. Both lungs are clear. The visualized skeletal structures are unremarkable. IMPRESSION: No active cardiopulmonary disease. Electronically Signed   By: Marijo Conception M.D.   On: 04/05/2022 13:15    EKG: Independently reviewed.  Sinus rhythm at 63 bpm.  PVCs noted.  Assessment/Plan Principal Problem:   Symptomatic anemia Active Problems:   Mild cognitive impairment   Essential tremor  Symptomatic anemia > Patient noted to have several weeks  of dizziness lightheadedness and dyspnea on exertion worsening for the past 2 days.  Hemoglobin of 4.0 with MCV of 115.  Also noted to have platelets of 55. > Reports some dark stool but no bloody stool or history of GI bleed.  Also reports nasal congestion with intermittent bloody drainage.  FOBT negative in ED. > With macrocytosis concern for possible B12 or folate deficiency. >  2 units ordered for transfusion in the ED. - Monitor on telemetry - Continue with 2 units - Trend hemoglobin - Check folate, B12 - Check iron studies - Check smear review  Mild cognitive impairment - Continue home donepezil, Namenda  Essential tremor - Continue home propranolol  Hypertension - On propranolol as above only  Anxiety - Continue home sertraline  DVT prophylaxis: SCDs for now  Code Status:   Full  Family Communication:  Updated at bedside  Disposition Plan:   Patient is from:  Home  Anticipated DC to:  Home  Anticipated DC date:  1 to 3 days  Anticipated DC barriers: None  Consults called:  None Admission status:  Observation, telemetry  Severity of Illness: The appropriate patient status for this patient is OBSERVATION. Observation status is judged to be reasonable and necessary in order to provide the required intensity of service to ensure the patient's safety. The patient's presenting symptoms, physical exam findings, and initial radiographic and laboratory data in the context of their medical condition is felt to place them at decreased risk for further clinical deterioration. Furthermore, it is anticipated that the patient will be medically stable for discharge from the hospital within 2 midnights of admission.    Marcelyn Bruins MD [facility]  How to contact the [facility] Attending or Consulting provider [place] or covering provider during after hours [place], for this patient?   Check the care team in [facility] and look for a) attending/consulting TR[facility] provider listed and b) the  [facility] team listed Log into [email or web address] and use [place] universal password to access. If you do not have the password, please contact the hospital operator. Locate the [facility] provider you are looking for under [facility] and page to a number that you can be directly reached. If you still have difficulty reaching the provider, please page the [facility] (Director on Call) for the Hospitalists listed on amion for assistance.  04/05/2022, 5:27 PM

## 2022-04-06 DIAGNOSIS — E538 Deficiency of other specified B group vitamins: Secondary | ICD-10-CM | POA: Diagnosis not present

## 2022-04-06 DIAGNOSIS — R0602 Shortness of breath: Secondary | ICD-10-CM | POA: Diagnosis not present

## 2022-04-06 DIAGNOSIS — Z79899 Other long term (current) drug therapy: Secondary | ICD-10-CM | POA: Diagnosis not present

## 2022-04-06 DIAGNOSIS — G250 Essential tremor: Secondary | ICD-10-CM | POA: Diagnosis not present

## 2022-04-06 DIAGNOSIS — D61818 Other pancytopenia: Secondary | ICD-10-CM | POA: Diagnosis not present

## 2022-04-06 DIAGNOSIS — Z1152 Encounter for screening for COVID-19: Secondary | ICD-10-CM | POA: Diagnosis not present

## 2022-04-06 DIAGNOSIS — I10 Essential (primary) hypertension: Secondary | ICD-10-CM | POA: Diagnosis not present

## 2022-04-06 DIAGNOSIS — R42 Dizziness and giddiness: Secondary | ICD-10-CM | POA: Diagnosis not present

## 2022-04-06 DIAGNOSIS — D649 Anemia, unspecified: Secondary | ICD-10-CM | POA: Diagnosis not present

## 2022-04-06 DIAGNOSIS — D696 Thrombocytopenia, unspecified: Secondary | ICD-10-CM | POA: Diagnosis not present

## 2022-04-06 DIAGNOSIS — R69 Illness, unspecified: Secondary | ICD-10-CM | POA: Diagnosis not present

## 2022-04-06 DIAGNOSIS — F17290 Nicotine dependence, other tobacco product, uncomplicated: Secondary | ICD-10-CM | POA: Diagnosis not present

## 2022-04-06 DIAGNOSIS — C9591 Leukemia, unspecified, in remission: Secondary | ICD-10-CM | POA: Diagnosis not present

## 2022-04-06 LAB — CBC
HCT: 16.6 % — ABNORMAL LOW (ref 39.0–52.0)
Hemoglobin: 5.6 g/dL — CL (ref 13.0–17.0)
MCH: 33.7 pg (ref 26.0–34.0)
MCHC: 33.7 g/dL (ref 30.0–36.0)
MCV: 100 fL (ref 80.0–100.0)
Platelets: 41 10*3/uL — ABNORMAL LOW (ref 150–400)
RBC: 1.66 MIL/uL — ABNORMAL LOW (ref 4.22–5.81)
RDW: 22.1 % — ABNORMAL HIGH (ref 11.5–15.5)
WBC: 5.6 10*3/uL (ref 4.0–10.5)
nRBC: 0.9 % — ABNORMAL HIGH (ref 0.0–0.2)

## 2022-04-06 LAB — HEMOGLOBIN AND HEMATOCRIT, BLOOD
HCT: 23 % — ABNORMAL LOW (ref 39.0–52.0)
Hemoglobin: 8.2 g/dL — ABNORMAL LOW (ref 13.0–17.0)

## 2022-04-06 LAB — COMPREHENSIVE METABOLIC PANEL
ALT: 9 U/L (ref 0–44)
AST: 14 U/L — ABNORMAL LOW (ref 15–41)
Albumin: 2.4 g/dL — ABNORMAL LOW (ref 3.5–5.0)
Alkaline Phosphatase: 42 U/L (ref 38–126)
Anion gap: 6 (ref 5–15)
BUN: 17 mg/dL (ref 8–23)
CO2: 24 mmol/L (ref 22–32)
Calcium: 8 mg/dL — ABNORMAL LOW (ref 8.9–10.3)
Chloride: 107 mmol/L (ref 98–111)
Creatinine, Ser: 1.09 mg/dL (ref 0.61–1.24)
GFR, Estimated: >60 mL/min (ref >60)
Glucose, Bld: 109 mg/dL — ABNORMAL HIGH (ref 70–99)
Potassium: 4.1 mmol/L (ref 3.5–5.1)
Sodium: 137 mmol/L (ref 135–145)
Total Bilirubin: 0.5 mg/dL (ref 0.3–1.2)
Total Protein: 5.8 g/dL — ABNORMAL LOW (ref 6.5–8.1)

## 2022-04-06 LAB — URIC ACID: Uric Acid, Serum: 5.1 mg/dL (ref 3.7–8.6)

## 2022-04-06 LAB — LACTATE DEHYDROGENASE: LDH: 165 U/L (ref 98–192)

## 2022-04-06 LAB — PREPARE RBC (CROSSMATCH)

## 2022-04-06 MED ORDER — GATIFLOXACIN 0.5 % OP SOLN
1.0000 [drp] | Freq: Every day | OPHTHALMIC | Status: DC
  Filled 2022-04-06: qty 2.5

## 2022-04-06 MED ORDER — PROPRANOLOL HCL ER 120 MG PO CP24: 120.0000 mg | ORAL_CAPSULE | Freq: Every day | ORAL | Status: DC

## 2022-04-06 MED ORDER — SODIUM CHLORIDE 0.9% IV SOLUTION: Freq: Once | INTRAVENOUS | Status: AC

## 2022-04-06 MED ORDER — SERTRALINE HCL 50 MG PO TABS
150.0000 mg | ORAL_TABLET | Freq: Every day | ORAL | Status: DC
  Administered 2022-04-06: 150 mg via ORAL
  Filled 2022-04-06: qty 1

## 2022-04-06 MED ORDER — FOLIC ACID 1 MG PO TABS
1.0000 mg | ORAL_TABLET | Freq: Every day | ORAL | Status: DC
  Administered 2022-04-06: 1 mg via ORAL
  Filled 2022-04-06: qty 1

## 2022-04-06 MED ORDER — KETOROLAC TROMETHAMINE 0.5 % OP SOLN
1.0000 [drp] | Freq: Every day | OPHTHALMIC | Status: DC
  Filled 2022-04-06: qty 5

## 2022-04-06 MED ORDER — FOLIC ACID 1 MG PO TABS: 1.0000 mg | ORAL_TABLET | Freq: Every day | ORAL | Status: AC

## 2022-04-06 NOTE — Consult Note (Signed)
Hematology/Oncology Consult Note  Clinical Summary: Christopher Lowery is a 72 year old male who presented with shortness of breath and dizziness and was found to have severe ctyopenias.   Reason for Consult: Pancytopenia, concerning for AML  HPI: Christopher Lowery is a 72 year old male with past medical history significant for HTN and anxiety presented on 04/05/2022 to the emergency department with  shortness of breath and dizziness. On arrival he was found to have a Hgb 4.0, WBC 5.7, MCV 115.7, and Plt 55. Initial labs showed ferritin 1802, sat ratio 82%, with a mildly low folate of 5.3. He received 2 units of PRBC on 04/05/2022 with a subsequent 3 units of 04/06/2022. On 3/1 her underwent a pathologist review of his blood film which revealed ciruclating blasts.  On exam today Christopher Lowery reports he does feel improved after his blood transfusions. He notes he is not having any overt bleeding other than a small amount of blood from his nostril. He is denying any fevers, chills, sweats or infectious symptoms, but has been coughing up clear phlegm. He is not having any chest pain or dizziness. He does not have any rash or petechiae.   The bulk of our discussion focused on the results of his peripheral blood studies and our concern for AML. We discussed likely diagnosis, current findings, and need for transfer to an academic center. He and his wife voiced their understanding of our findings and the plan moving forward.   O:  Vitals:   04/06/22 1410 04/06/22 1620  BP: 113/69 129/66  Pulse: (!) 55 66  Resp: 16 16  Temp: 98.9 F (37.2 C) 98.3 F (36.8 C)  SpO2: 100% 100%      Latest Ref Rng & Units 04/06/2022    5:49 AM 04/05/2022    1:00 PM  CMP  Glucose 70 - 99 mg/dL 109  119   BUN 8 - 23 mg/dL 17  19   Creatinine 0.61 - 1.24 mg/dL 1.09  1.21   Sodium 135 - 145 mmol/L 137  140   Potassium 3.5 - 5.1 mmol/L 4.1  3.9   Chloride 98 - 111 mmol/L 107  109   CO2 22 - 32 mmol/L 24  22   Calcium  8.9 - 10.3 mg/dL 8.0  8.5   Total Protein 6.5 - 8.1 g/dL 5.8    Total Bilirubin 0.3 - 1.2 mg/dL 0.5    Alkaline Phos 38 - 126 U/L 42    AST 15 - 41 U/L 14    ALT 0 - 44 U/L 9        Latest Ref Rng & Units 04/06/2022    6:42 PM 04/06/2022    5:49 AM 04/05/2022    8:28 PM  CBC  WBC 4.0 - 10.5 K/uL  5.6  6.4   Hemoglobin 13.0 - 17.0 g/dL 8.2  5.6  5.3   Hematocrit 39.0 - 52.0 % 23.0  16.6  15.1   Platelets 150 - 400 K/uL  41  50       GENERAL: well appearing elderly Caucasian male in NAD  SKIN: skin color, texture, turgor are normal, no rashes or significant lesions EYES: conjunctiva are pink and non-injected, sclera clear LUNGS: clear to auscultation and percussion with normal breathing effort HEART: regular rate & rhythm and no murmurs and no lower extremity edema Musculoskeletal: no cyanosis of digits and no clubbing  PSYCH: alert & oriented x 3, fluent speech NEURO: no focal motor/sensory deficits  Assessment/Plan:  Christopher Lowery is a 72 year old male who presented with dizziness and shortness of breath and was found to have circulating blasts and severe pancytopenia, 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  # Circulating Blasts # Severe Pancytopenia --labs today showed WBC 5.6 with ANC 0.0, Hgb 5.6, MCV 100, and Plt 41 --my personal review of the peripheral blood smear shows circulating blasts. Pathologist reviewed smear and confirmed these findings. --recommend transfer to tertiary care center for further evaluation and management. Strong clinic suspicion of AML. Patient seems fit, likely candidate for intensive inpatient chemotherapy --will defer bone marrow biopsy, echo, and access placement to [facility]. If patient's transfer is delayed we can perform those here, but not likely to occur over the weekend.  --Oncology will continue to follow while patient is in house and will hep facilitate transfer as needed.    Ledell Peoples, MD Department of Hematology/Oncology [place] at [facility] Phone: [phone] Pager: [phone] Email: [email or web address]

## 2022-04-06 NOTE — Progress Notes (Addendum)
PROGRESS NOTE    Christopher Lowery  [id number] DOB: October 27, 1950 DOA: 04/05/2022 PCP: Sharilyn Sites, MD    Brief Narrative:   Christopher Lowery is a 72 y.o. male with past medical history significant for HTN, essential tremor, anxiety, mild cognitive impairment who presented to [facility] ED on 2/29 with complaints of dizziness/lightheadedness, palpitations, shortness of breath ongoing over the last few weeks; but significantly progressing over the last few days.  Patient reports dark stools with no history of prior GI bleed.  Denies fever/chills, no chest pain, no abdominal pain, no nausea/vomiting/diarrhea.  In the ED, temperature 98.2 F, HR 52, RR 20, BP 114/72, SpO2 100% on room air.  WBC 5.7, hemoglobin 4.0, platelets 55.  Sodium 140, potassium 3.9, chloride 109, CO2 22, glucose 119, BUN 19, creatinine 1.21.  COVID-19 PCR/influenza A/B PCR/RSV PCR negative.  High sensitive troponin 11 followed by 8.  BNP 754.3.  Chest x-ray with no active cardiopulmonary disease process.  FOBT negative.  2 unit PRBCs ordered in the ED.  TRH consulted for evaluation management of symptomatic anemia.  Assessment & Plan:   Symptomatic anemia Folic acid deficiency Thrombocytopenia Patient presenting to ED with progressive shortness of breath, dizziness, lightheadedness over the past 3 weeks which has progressed over the last few days.  Patient was noted to have a hemoglobin of 4.0 on admission.  FOBT negative.  Not on antiplatelets or blood thinners at baseline.  Anemia panel with iron 188, TIBC 218, ferritin 86, folate 5.3, B12 7414. -- Hgb 4.0>5.3>5.6 -- Plt 55>50>41 -- Blood smear: Pending -- Transfused 2 unit PRBCs on 2/29; transfuse 3 additional units today -- Start folic acid 1 mg p.o. daily -- Repeat H&H following transfusion, CBC in a.m.  Acute leukemia Received call from pathology, Dr. 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 -- Medical  oncology consultation: Pending  Essential hypertension Essential tremor -- Propranolol ER 120 mg p.o. daily  Anxiety-depression -- Sertraline 150 mg p.o. daily  Mild cognitive impairment -- Namenda 10 mg p.o. twice daily  DVT prophylaxis: SCDs Start: 04/05/22 1527    Code Status: Full Code Family Communication: No family present at bedside this morning  Disposition Plan:  Level of care: Telemetry Medical Status is: Observation The patient remains OBS appropriate and will d/c before 2 midnights.    Consultants:  None  Procedures:  None  Antimicrobials:  None   Subjective: Patient seen examined bedside, resting comfortably.  Lying in bed.  Transfused 2 unit PRBCs yesterday.  Feels much improved in terms of his dizziness/lightheadedness and shortness of breath.  Blood counts remain low at 5.6 this morning.  Discussed folic acid deficiency initiation of folate today.  Also will transfuse 3 additional units.  No other specific questions or concerns at this time.  Denies headache, no visual changes, no chest pain, no palpitations, no shortness of breath, no fever/chills/night sweats, no nausea/vomiting/diarrhea, no focal weakness, no fatigue, no paresthesias.  No acute events overnight per nursing staff.  Objective: Vitals:   04/06/22 0807 04/06/22 1015 04/06/22 1051 04/06/22 1100  BP: 111/69 117/78 116/64 124/75  Pulse: (!) 59 (!) 57 (!) 55 (!) 58  Resp: '18 16 16 16  '$ Temp: 98.8 F (37.1 C) 97.7 F (36.5 C) 98.1 F (36.7 C) 98.4 F (36.9 C)  TempSrc: Oral Oral Oral Oral  SpO2: 100%  99% 100%  Weight:      Height:        Intake/Output Summary (Last  24 hours) at 04/06/2022 1155 Last data filed at 04/06/2022 1015 Gross per 24 hour  Intake 968 ml  Output --  Net 968 ml   Filed Weights   04/05/22 1249 04/05/22 1856 04/06/22 0359  Weight: 99.8 kg 87.8 kg 89.7 kg    Examination:  Physical Exam: GEN: NAD, alert and oriented x 3, wd/wn HEENT: NCAT, PERRL, EOMI,  sclera clear, MMM PULM: CTAB w/o wheezes/crackles, normal respiratory effort, on room air CV: RRR w/o M/G/R GI: abd soft, NTND, NABS, no R/G/M MSK: no peripheral edema, muscle strength globally intact 5/5 bilateral upper/lower extremities NEURO: CN II-XII intact, no focal deficits, sensation to light touch intact PSYCH: normal mood/affect Integumentary: dry/intact, no rashes or wounds    Data Reviewed: I have personally reviewed following labs and imaging studies  CBC: Recent Labs  Lab 04/05/22 1300 04/05/22 2028 04/06/22 0549  WBC 5.7 6.4 5.6  NEUTROABS  --  0.0*  --   HGB 4.0* 5.3* 5.6*  HCT 12.5* 15.1* 16.6*  MCV 115.7* 104.1* 100.0  PLT 55* 50* 41*   Basic Metabolic Panel: Recent Labs  Lab 04/05/22 1300 04/06/22 0549  NA 140 137  K 3.9 4.1  CL 109 107  CO2 22 24  GLUCOSE 119* 109*  BUN 19 17  CREATININE 1.21 1.09  CALCIUM 8.5* 8.0*   GFR: Estimated Creatinine Clearance: 68.6 mL/min (by C-G formula based on SCr of 1.09 mg/dL). Liver Function Tests: Recent Labs  Lab 04/06/22 0549  AST 14*  ALT 9  ALKPHOS 42  BILITOT 0.5  PROT 5.8*  ALBUMIN 2.4*   No results for input(s): "LIPASE", "AMYLASE" in the last 168 hours. No results for input(s): "AMMONIA" in the last 168 hours. Coagulation Profile: No results for input(s): "INR", "PROTIME" in the last 168 hours. Cardiac Enzymes: No results for input(s): "CKTOTAL", "CKMB", "CKMBINDEX", "TROPONINI" in the last 168 hours. BNP (last 3 results) No results for input(s): "PROBNP" in the last 8760 hours. HbA1C: No results for input(s): "HGBA1C" in the last 72 hours. CBG: No results for input(s): "GLUCAP" in the last 168 hours. Lipid Profile: No results for input(s): "CHOL", "HDL", "LDLCALC", "TRIG", "CHOLHDL", "LDLDIRECT" in the last 72 hours. Thyroid Function Tests: No results for input(s): "TSH", "T4TOTAL", "FREET4", "T3FREE", "THYROIDAB" in the last 72 hours. Anemia Panel: Recent Labs    04/05/22 1616  04/05/22 2028  VITAMINB12  --  7,414*  FOLATE  --  5.3*  FERRITIN 1,802*  --   TIBC 218*  --   IRON 188*  --   RETICCTPCT  --  1.5   Sepsis Labs: No results for input(s): "PROCALCITON", "LATICACIDVEN" in the last 168 hours.  Recent Results (from the past 240 hour(s))  Resp panel by RT-PCR (RSV, Flu A&B, Covid) Anterior Nasal Swab     Status: None   Collection Time: 04/05/22  1:54 PM   Specimen: Anterior Nasal Swab  Result Value Ref Range Status   SARS Coronavirus 2 by RT PCR NEGATIVE NEGATIVE Final   Influenza A by PCR NEGATIVE NEGATIVE Final   Influenza B by PCR NEGATIVE NEGATIVE Final    Comment: (NOTE) The Xpert Xpress SARS-CoV-2/FLU/RSV plus assay is intended as an aid in the diagnosis of influenza from Nasopharyngeal swab specimens and should not be used as a sole basis for treatment. Nasal washings and aspirates are unacceptable for Xpert Xpress SARS-CoV-2/FLU/RSV testing.  Fact Sheet for Patients: EntrepreneurPulse.com.au  Fact Sheet for Healthcare Providers: IncredibleEmployment.be  This test is not yet approved or cleared  by the [place] and has been authorized for detection and/or diagnosis of SARS-CoV-2 by FDA under an Emergency Use Authorization (EUA). This EUA will remain in effect (meaning this test can be used) for the duration of the COVID-19 declaration under Section 564(b)(1) of the Act, 21 U.S.C. section 360bbb-3(b)(1), unless the authorization is terminated or revoked.     Resp Syncytial Virus by PCR NEGATIVE NEGATIVE Final    Comment: (NOTE) Fact Sheet for Patients: EntrepreneurPulse.com.au  Fact Sheet for Healthcare Providers: IncredibleEmployment.be  This test is not yet approved or cleared by the [place] FDA and has been authorized for detection and/or diagnosis of SARS-CoV-2 by FDA under an Emergency Use Authorization (EUA). This EUA will remain in effect  (meaning this test can be used) for the duration of the COVID-19 declaration under Section 564(b)(1) of the Act, 21 U.S.C. section 360bbb-3(b)(1), unless the authorization is terminated or revoked.  Performed at [facility], [place] [address] [place], [place] [address]          Radiology Studies: DG Chest 2 View  Result Date: 04/05/2022 CLINICAL DATA:  Dyspnea with exertion. EXAM: CHEST - 2 VIEW COMPARISON:  None Available. FINDINGS: Mild cardiomegaly. Both lungs are clear. The visualized skeletal structures are unremarkable. IMPRESSION: No active cardiopulmonary disease. Electronically Signed   By: Marijo Conception M.D.   On: 04/05/2022 13:15        Scheduled Meds:  donepezil  10 mg Oral QHS   folic acid  1 mg Oral Daily   memantine  10 mg Oral BID   propranolol  20 mg Oral TID   sertraline  150 mg Oral Daily   sodium chloride flush  3 mL Intravenous Q12H   Continuous Infusions:   LOS: 0 days    Time spent: 52 minutes spent on chart review, discussion with nursing staff, consultants, updating family and interview/physical exam; more than 50% of that time was spent in counseling and/or coordination of care.    Kraig Genis J [place] DO [facility] Available via Epic secure chat 7am-7pm After these hours, please refer to coverage provider listed on [email or web address] 04/06/2022, 11:55 AM

## 2022-04-06 NOTE — Progress Notes (Signed)
[facility] called for transport. 

## 2022-04-06 NOTE — Plan of Care (Signed)
Pt transferred to [place] via [facility]. EMTALA form complete. Pt stable with no c/o pain or distress

## 2022-04-06 NOTE — Discharge Summary (Signed)
Physician Discharge Summary  Christopher Lowery [id number] DOB: 04-Nov-1950 DOA: 04/05/2022  PCP: Sharilyn Sites, MD  Admit date: 04/05/2022 Discharge date: 04/06/2022  Admitted From: Home Disposition: Transfer [facility], accepting physician Dr. Phill Myron, MD with hematology/oncology   Discharge Condition: Stable CODE STATUS: Full code Diet recommendation: Heart healthy diet  History of present illness:  Christopher Lowery is a 73 y.o. male with past medical history significant for HTN, essential tremor, anxiety, mild cognitive impairment who presented to [facility] ED on 2/29 with complaints of dizziness/lightheadedness, palpitations, shortness of breath ongoing over the last few weeks; but significantly progressing over the last few days.  Patient reports dark stools with no history of prior GI bleed.  Denies fever/chills, no chest pain, no abdominal pain, no nausea/vomiting/diarrhea.   In the ED, temperature 98.2 F, HR 52, RR 20, BP 114/72, SpO2 100% on room air.  WBC 5.7, hemoglobin 4.0, platelets 55.  Sodium 140, potassium 3.9, chloride 109, CO2 22, glucose 119, BUN 19, creatinine 1.21.  COVID-19 PCR/influenza A/B PCR/RSV PCR negative.  High sensitive troponin 11 followed by 8.  BNP 754.3.  Chest x-ray with no active cardiopulmonary disease process.  FOBT negative.  2 unit PRBCs ordered in the ED.  TRH consulted for evaluation management of symptomatic anemia.  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  Patient was noted to have a hemoglobin of 4.0 on admission.  FOBT negative.  Not on antiplatelets or blood thinners at baseline.  Anemia panel with iron 188, TIBC 218, ferritin 86, folate 5.3, B12 7414.  After 2 unit PRBC transfusion hemoglobin improved to 5.6 and  3 additional units were ordered.  Patient was started on folic acid 1 mg p.o. daily.  Received call from pathology, Dr. 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  Discussed case with medical oncology, Dr. Lorenso Courier who recommended transfer to tertiary center for further evaluation and management.   Essential hypertension Essential tremor Continue home propranolol ER 120 mg p.o. daily   Anxiety-depression Continue sertraline   Mild cognitive impairment Namenda 10 mg p.o. twice daily  Discharge Diagnoses:  Principal Problem:   Symptomatic anemia Active Problems:   Mild cognitive impairment   Essential tremor    Discharge Instructions  Discharge Instructions     Diet - low sodium heart healthy   Complete by: As directed    Increase activity slowly   Complete by: As directed       Allergies as of 04/06/2022   No Known Allergies      Medication List     STOP taking these medications    donepezil 10 MG tablet Commonly known as: ARICEPT       TAKE these medications    Besivance 0.6 % Susp Generic drug: Besifloxacin HCl Place 1 drop into both eyes daily.   folic acid 1 MG tablet Commonly known as: FOLVITE Take 1 tablet (1 mg total) by mouth daily. Start taking on: April 07, 2022   ibuprofen 200 MG tablet Commonly known as: ADVIL Take 200 mg by mouth as needed for moderate pain.   memantine 10 MG tablet Commonly known as: Namenda Take 1 tablet (10 mg total) by mouth 2 (two) times daily.   mupirocin ointment 2 % Commonly known as: BACTROBAN Apply 1  Application topically 3 (three) times daily.   Prolensa 0.07 % Soln Generic drug: Bromfenac Sodium Place 1 drop into both eyes daily.   propranolol ER 120 MG 24 hr capsule Commonly known as: INDERAL LA Take 120 mg by mouth daily. What changed: Another medication with the same name was removed. Continue taking this medication, and follow the  directions you see here.   sertraline 100 MG tablet Commonly known as: ZOLOFT Take 200 mg by mouth daily.   testosterone cypionate 200 MG/ML injection Commonly known as: DEPOTESTOSTERONE CYPIONATE Inject 100 mg into the muscle once a week.   valACYclovir 1000 MG tablet Commonly known as: VALTREX Take 2,000 mg by mouth 2 (two) times daily as needed (herpes breakout).   Vitamin D (Ergocalciferol) 1.25 MG (50000 UNIT) Caps capsule Commonly known as: DRISDOL Take 50,000 Units by mouth once a week.   vitamin E 200 UNIT capsule Take 400 Units by mouth in the morning and at bedtime.   zolpidem 10 MG tablet Commonly known as: AMBIEN Take 5 mg by mouth at bedtime as needed for sleep.        No Known Allergies  Consultations: Medical oncology, Dr. Lorenso Courier   Procedures/Studies: DG Chest 2 View  Result Date: 04/05/2022 CLINICAL DATA:  Dyspnea with exertion. EXAM: CHEST - 2 VIEW COMPARISON:  None Available. FINDINGS: Mild cardiomegaly. Both lungs are clear. The visualized skeletal structures are unremarkable. IMPRESSION: No active cardiopulmonary disease. Electronically Signed   By: Marijo Conception M.D.   On: 04/05/2022 13:15     Subjective:   Discharge Exam: Vitals:   04/06/22 1410 04/06/22 1620  BP: 113/69 129/66  Pulse: (!) 55 66  Resp: 16 16  Temp: 98.9 F (37.2 C) 98.3 F (36.8 C)  SpO2: 100% 100%   Vitals:   04/06/22 1328 04/06/22 1345 04/06/22 1410 04/06/22 1620  BP: 109/63 109/63 113/69 129/66  Pulse: (!) 56 (!) 56 (!) 55 66  Resp: '16 16 16 16  '$ Temp: 98.1 F (36.7 C) 98.1 F (36.7 C) 98.9 F (37.2 C) 98.3 F (36.8 C)  TempSrc: Oral Oral Oral Oral  SpO2: 100% 100% 100% 100%  Weight:      Height:        Physical Exam: GEN: NAD, alert and oriented x 3, wd/wn, slightly pale and action HEENT: NCAT, PERRL, EOMI, sclera clear, MMM PULM: CTAB w/o wheezes/crackles, normal respiratory effort, on room air CV: RRR w/o M/G/R GI: abd soft, NTND, NABS, no  R/G/M MSK: no peripheral edema, muscle strength globally intact 5/5 bilateral upper/lower extremities NEURO: CN II-XII intact, no focal deficits, sensation to light touch intact PSYCH: normal mood/affect Integumentary: dry/intact, no rashes or wounds    The results of significant diagnostics from this hospitalization (including imaging, microbiology, ancillary and laboratory) are listed below for reference.     Microbiology: Recent Results (from the past 240 hour(s))  Resp panel by RT-PCR (RSV, Flu A&B, Covid) Anterior Nasal Swab     Status: None   Collection Time: 04/05/22  1:54 PM   Specimen: Anterior Nasal Swab  Result Value Ref Range Status   SARS Coronavirus 2 by RT PCR NEGATIVE NEGATIVE Final   Influenza A by PCR NEGATIVE NEGATIVE Final   Influenza B by PCR NEGATIVE NEGATIVE Final    Comment: (NOTE) The Xpert Xpress SARS-CoV-2/FLU/RSV plus assay is intended as an aid in the diagnosis of influenza from Nasopharyngeal swab specimens and should not be used as a sole basis for treatment. Nasal washings and  aspirates are unacceptable for Xpert Xpress SARS-CoV-2/FLU/RSV testing.  Fact Sheet for Patients: EntrepreneurPulse.com.au  Fact Sheet for Healthcare Providers: IncredibleEmployment.be  This test is not yet approved or cleared by the [place] FDA and has been authorized for detection and/or diagnosis of SARS-CoV-2 by FDA under an Emergency Use Authorization (EUA). This EUA will remain in effect (meaning this test can be used) for the duration of the COVID-19 declaration under Section 564(b)(1) of the Act, 21 U.S.C. section 360bbb-3(b)(1), unless the authorization is terminated or revoked.     Resp Syncytial Virus by PCR NEGATIVE NEGATIVE Final    Comment: (NOTE) Fact Sheet for Patients: EntrepreneurPulse.com.au  Fact Sheet for Healthcare Providers: IncredibleEmployment.be  This test is not  yet approved or cleared by the [place] FDA and has been authorized for detection and/or diagnosis of SARS-CoV-2 by FDA under an Emergency Use Authorization (EUA). This EUA will remain in effect (meaning this test can be used) for the duration of the COVID-19 declaration under Section 564(b)(1) of the Act, 21 U.S.C. section 360bbb-3(b)(1), unless the authorization is terminated or revoked.  Performed at [facility], [place] [address] [place], [place] [address]      Labs: BNP (last 3 results) Recent Labs    04/05/22 1455  BNP 99991111*   Basic Metabolic Panel: Recent Labs  Lab 04/05/22 1300 04/06/22 0549  NA 140 137  K 3.9 4.1  CL 109 107  CO2 22 24  GLUCOSE 119* 109*  BUN 19 17  CREATININE 1.21 1.09  CALCIUM 8.5* 8.0*   Liver Function Tests: Recent Labs  Lab 04/06/22 0549  AST 14*  ALT 9  ALKPHOS 42  BILITOT 0.5  PROT 5.8*  ALBUMIN 2.4*   No results for input(s): "LIPASE", "AMYLASE" in the last 168 hours. No results for input(s): "AMMONIA" in the last 168 hours. CBC: Recent Labs  Lab 04/05/22 1300 04/05/22 2028 04/06/22 0549  WBC 5.7 6.4 5.6  NEUTROABS  --  0.0*  --   HGB 4.0* 5.3* 5.6*  HCT 12.5* 15.1* 16.6*  MCV 115.7* 104.1* 100.0  PLT 55* 50* 41*   Cardiac Enzymes: No results for input(s): "CKTOTAL", "CKMB", "CKMBINDEX", "TROPONINI" in the last 168 hours. BNP: Invalid input(s): "POCBNP" CBG: No results for input(s): "GLUCAP" in the last 168 hours. D-Dimer No results for input(s): "DDIMER" in the last 72 hours. Hgb A1c No results for input(s): "HGBA1C" in the last 72 hours. Lipid Profile No results for input(s): "CHOL", "HDL", "LDLCALC", "TRIG", "CHOLHDL", "LDLDIRECT" in the last 72 hours. Thyroid function studies No results for input(s): "TSH", "T4TOTAL", "T3FREE", "THYROIDAB" in the last 72 hours.  Invalid input(s): "FREET3" Anemia work up Recent Labs    04/05/22 1616 04/05/22 2028  VITAMINB12  --  7,414*  FOLATE  --  5.3*   FERRITIN 1,802*  --   TIBC 218*  --   IRON 188*  --   RETICCTPCT  --  1.5   Urinalysis No results found for: "COLORURINE", "APPEARANCEUR", "LABSPEC", "PHURINE", "GLUCOSEU", "HGBUR", "BILIRUBINUR", "KETONESUR", "PROTEINUR", "UROBILINOGEN", "NITRITE", "LEUKOCYTESUR" Sepsis Labs Recent Labs  Lab 04/05/22 1300 04/05/22 2028 04/06/22 0549  WBC 5.7 6.4 5.6   Microbiology Recent Results (from the past 240 hour(s))  Resp panel by RT-PCR (RSV, Flu A&B, Covid) Anterior Nasal Swab     Status: None   Collection Time: 04/05/22  1:54 PM   Specimen: Anterior Nasal Swab  Result Value Ref Range Status   SARS Coronavirus 2 by RT PCR NEGATIVE NEGATIVE Final  Influenza A by PCR NEGATIVE NEGATIVE Final   Influenza B by PCR NEGATIVE NEGATIVE Final    Comment: (NOTE) The Xpert Xpress SARS-CoV-2/FLU/RSV plus assay is intended as an aid in the diagnosis of influenza from Nasopharyngeal swab specimens and should not be used as a sole basis for treatment. Nasal washings and aspirates are unacceptable for Xpert Xpress SARS-CoV-2/FLU/RSV testing.  Fact Sheet for Patients: EntrepreneurPulse.com.au  Fact Sheet for Healthcare Providers: IncredibleEmployment.be  This test is not yet approved or cleared by the [place] FDA and has been authorized for detection and/or diagnosis of SARS-CoV-2 by FDA under an Emergency Use Authorization (EUA). This EUA will remain in effect (meaning this test can be used) for the duration of the COVID-19 declaration under Section 564(b)(1) of the Act, 21 U.S.C. section 360bbb-3(b)(1), unless the authorization is terminated or revoked.     Resp Syncytial Virus by PCR NEGATIVE NEGATIVE Final    Comment: (NOTE) Fact Sheet for Patients: EntrepreneurPulse.com.au  Fact Sheet for Healthcare Providers: IncredibleEmployment.be  This test is not yet approved or cleared by the [place] FDA  and has been authorized for detection and/or diagnosis of SARS-CoV-2 by FDA under an Emergency Use Authorization (EUA). This EUA will remain in effect (meaning this test can be used) for the duration of the COVID-19 declaration under Section 564(b)(1) of the Act, 21 U.S.C. section 360bbb-3(b)(1), unless the authorization is terminated or revoked.  Performed at [facility], [place] [address], [place], [place] [address]      Time coordinating discharge: Over 30 minutes  SIGNED:   Deola Rewis J [place] DO  [facility] 04/06/2022, 5:19 PM

## 2022-04-06 NOTE — Progress Notes (Signed)
Report given to [place], Therapist, sports at [facility]

## 2022-04-07 DIAGNOSIS — Z79899 Other long term (current) drug therapy: Secondary | ICD-10-CM | POA: Diagnosis not present

## 2022-04-07 DIAGNOSIS — Z95828 Presence of other vascular implants and grafts: Secondary | ICD-10-CM | POA: Diagnosis not present

## 2022-04-07 DIAGNOSIS — E041 Nontoxic single thyroid nodule: Secondary | ICD-10-CM | POA: Diagnosis not present

## 2022-04-07 DIAGNOSIS — R21 Rash and other nonspecific skin eruption: Secondary | ICD-10-CM | POA: Diagnosis not present

## 2022-04-07 DIAGNOSIS — G4700 Insomnia, unspecified: Secondary | ICD-10-CM | POA: Diagnosis not present

## 2022-04-07 DIAGNOSIS — E8770 Fluid overload, unspecified: Secondary | ICD-10-CM | POA: Diagnosis not present

## 2022-04-07 DIAGNOSIS — J811 Chronic pulmonary edema: Secondary | ICD-10-CM | POA: Diagnosis not present

## 2022-04-07 DIAGNOSIS — J3489 Other specified disorders of nose and nasal sinuses: Secondary | ICD-10-CM | POA: Diagnosis not present

## 2022-04-07 DIAGNOSIS — I10 Essential (primary) hypertension: Secondary | ICD-10-CM | POA: Diagnosis not present

## 2022-04-07 DIAGNOSIS — I340 Nonrheumatic mitral (valve) insufficiency: Secondary | ICD-10-CM | POA: Diagnosis not present

## 2022-04-07 DIAGNOSIS — D709 Neutropenia, unspecified: Secondary | ICD-10-CM | POA: Diagnosis not present

## 2022-04-07 DIAGNOSIS — I519 Heart disease, unspecified: Secondary | ICD-10-CM | POA: Diagnosis not present

## 2022-04-07 DIAGNOSIS — R002 Palpitations: Secondary | ICD-10-CM | POA: Diagnosis not present

## 2022-04-07 DIAGNOSIS — I270 Primary pulmonary hypertension: Secondary | ICD-10-CM | POA: Diagnosis not present

## 2022-04-07 DIAGNOSIS — D649 Anemia, unspecified: Secondary | ICD-10-CM | POA: Diagnosis not present

## 2022-04-07 DIAGNOSIS — Y848 Other medical procedures as the cause of abnormal reaction of the patient, or of later complication, without mention of misadventure at the time of the procedure: Secondary | ICD-10-CM | POA: Diagnosis not present

## 2022-04-07 DIAGNOSIS — I272 Pulmonary hypertension, unspecified: Secondary | ICD-10-CM | POA: Diagnosis not present

## 2022-04-07 DIAGNOSIS — C9200 Acute myeloblastic leukemia, not having achieved remission: Secondary | ICD-10-CM | POA: Diagnosis not present

## 2022-04-07 DIAGNOSIS — I9589 Other hypotension: Secondary | ICD-10-CM | POA: Diagnosis not present

## 2022-04-07 DIAGNOSIS — G3184 Mild cognitive impairment, so stated: Secondary | ICD-10-CM | POA: Diagnosis not present

## 2022-04-07 DIAGNOSIS — I499 Cardiac arrhythmia, unspecified: Secondary | ICD-10-CM | POA: Diagnosis not present

## 2022-04-07 DIAGNOSIS — R197 Diarrhea, unspecified: Secondary | ICD-10-CM | POA: Diagnosis not present

## 2022-04-07 DIAGNOSIS — I82612 Acute embolism and thrombosis of superficial veins of left upper extremity: Secondary | ICD-10-CM | POA: Diagnosis not present

## 2022-04-07 DIAGNOSIS — D696 Thrombocytopenia, unspecified: Secondary | ICD-10-CM | POA: Diagnosis not present

## 2022-04-07 DIAGNOSIS — L299 Pruritus, unspecified: Secondary | ICD-10-CM | POA: Diagnosis not present

## 2022-04-07 DIAGNOSIS — B009 Herpesviral infection, unspecified: Secondary | ICD-10-CM | POA: Diagnosis not present

## 2022-04-07 DIAGNOSIS — Z452 Encounter for adjustment and management of vascular access device: Secondary | ICD-10-CM | POA: Diagnosis not present

## 2022-04-07 DIAGNOSIS — Z006 Encounter for examination for normal comparison and control in clinical research program: Secondary | ICD-10-CM | POA: Diagnosis not present

## 2022-04-07 DIAGNOSIS — D61818 Other pancytopenia: Secondary | ICD-10-CM | POA: Diagnosis not present

## 2022-04-07 DIAGNOSIS — G252 Other specified forms of tremor: Secondary | ICD-10-CM | POA: Diagnosis not present

## 2022-04-07 DIAGNOSIS — R69 Illness, unspecified: Secondary | ICD-10-CM | POA: Diagnosis not present

## 2022-04-07 DIAGNOSIS — R2243 Localized swelling, mass and lump, lower limb, bilateral: Secondary | ICD-10-CM | POA: Diagnosis not present

## 2022-04-07 DIAGNOSIS — T451X5A Adverse effect of antineoplastic and immunosuppressive drugs, initial encounter: Secondary | ICD-10-CM | POA: Diagnosis not present

## 2022-04-07 DIAGNOSIS — I083 Combined rheumatic disorders of mitral, aortic and tricuspid valves: Secondary | ICD-10-CM | POA: Diagnosis not present

## 2022-04-07 DIAGNOSIS — R0902 Hypoxemia: Secondary | ICD-10-CM | POA: Diagnosis not present

## 2022-04-07 DIAGNOSIS — K5900 Constipation, unspecified: Secondary | ICD-10-CM | POA: Diagnosis not present

## 2022-04-07 DIAGNOSIS — R609 Edema, unspecified: Secondary | ICD-10-CM | POA: Diagnosis not present

## 2022-04-07 DIAGNOSIS — I517 Cardiomegaly: Secondary | ICD-10-CM | POA: Diagnosis not present

## 2022-04-07 DIAGNOSIS — D61810 Antineoplastic chemotherapy induced pancytopenia: Secondary | ICD-10-CM | POA: Diagnosis not present

## 2022-04-07 DIAGNOSIS — R040 Epistaxis: Secondary | ICD-10-CM | POA: Diagnosis not present

## 2022-04-07 DIAGNOSIS — J90 Pleural effusion, not elsewhere classified: Secondary | ICD-10-CM | POA: Diagnosis not present

## 2022-04-07 DIAGNOSIS — J810 Acute pulmonary edema: Secondary | ICD-10-CM | POA: Diagnosis not present

## 2022-04-07 DIAGNOSIS — G250 Essential tremor: Secondary | ICD-10-CM | POA: Diagnosis not present

## 2022-04-07 DIAGNOSIS — I491 Atrial premature depolarization: Secondary | ICD-10-CM | POA: Diagnosis not present

## 2022-04-07 DIAGNOSIS — R5081 Fever presenting with conditions classified elsewhere: Secondary | ICD-10-CM | POA: Diagnosis not present

## 2022-04-07 DIAGNOSIS — R0981 Nasal congestion: Secondary | ICD-10-CM | POA: Diagnosis not present

## 2022-04-07 DIAGNOSIS — C9500 Acute leukemia of unspecified cell type not having achieved remission: Secondary | ICD-10-CM | POA: Diagnosis not present

## 2022-04-07 DIAGNOSIS — F32A Depression, unspecified: Secondary | ICD-10-CM | POA: Diagnosis not present

## 2022-04-07 DIAGNOSIS — R001 Bradycardia, unspecified: Secondary | ICD-10-CM | POA: Diagnosis not present

## 2022-04-07 LAB — BPAM RBC
Blood Product Expiration Date: 202403262359
Blood Product Expiration Date: 202403262359
Blood Product Expiration Date: 202403282359
Blood Product Expiration Date: 202403292359
Blood Product Expiration Date: 202403292359
ISSUE DATE / TIME: 202402291533
ISSUE DATE / TIME: 202402292039
ISSUE DATE / TIME: 202403010747
ISSUE DATE / TIME: 202403011040
ISSUE DATE / TIME: 202403011335
Unit Type and Rh: 5100
Unit Type and Rh: 5100
Unit Type and Rh: 5100
Unit Type and Rh: 5100
Unit Type and Rh: 5100

## 2022-04-07 LAB — TYPE AND SCREEN
ABO/RH(D): O POS
Antibody Screen: NEGATIVE
Unit division: 0
Unit division: 0
Unit division: 0
Unit division: 0
Unit division: 0

## 2022-04-07 NOTE — Plan of Care (Signed)

## 2022-04-08 DIAGNOSIS — D649 Anemia, unspecified: Secondary | ICD-10-CM | POA: Diagnosis not present

## 2022-04-08 DIAGNOSIS — R69 Illness, unspecified: Secondary | ICD-10-CM | POA: Diagnosis not present

## 2022-04-08 DIAGNOSIS — I272 Pulmonary hypertension, unspecified: Secondary | ICD-10-CM | POA: Diagnosis not present

## 2022-04-08 DIAGNOSIS — G3184 Mild cognitive impairment, so stated: Secondary | ICD-10-CM | POA: Diagnosis not present

## 2022-04-08 DIAGNOSIS — G252 Other specified forms of tremor: Secondary | ICD-10-CM | POA: Diagnosis not present

## 2022-04-08 DIAGNOSIS — I083 Combined rheumatic disorders of mitral, aortic and tricuspid valves: Secondary | ICD-10-CM | POA: Diagnosis not present

## 2022-04-08 DIAGNOSIS — D696 Thrombocytopenia, unspecified: Secondary | ICD-10-CM | POA: Diagnosis not present

## 2022-04-08 DIAGNOSIS — D709 Neutropenia, unspecified: Secondary | ICD-10-CM | POA: Diagnosis not present

## 2022-04-09 DIAGNOSIS — D696 Thrombocytopenia, unspecified: Secondary | ICD-10-CM | POA: Diagnosis not present

## 2022-04-09 DIAGNOSIS — C9500 Acute leukemia of unspecified cell type not having achieved remission: Secondary | ICD-10-CM | POA: Diagnosis not present

## 2022-04-09 DIAGNOSIS — D649 Anemia, unspecified: Secondary | ICD-10-CM | POA: Diagnosis not present

## 2022-04-09 DIAGNOSIS — D709 Neutropenia, unspecified: Secondary | ICD-10-CM | POA: Diagnosis not present

## 2022-04-09 DIAGNOSIS — G3184 Mild cognitive impairment, so stated: Secondary | ICD-10-CM | POA: Diagnosis not present

## 2022-04-09 DIAGNOSIS — G252 Other specified forms of tremor: Secondary | ICD-10-CM | POA: Diagnosis not present

## 2022-04-09 DIAGNOSIS — C9200 Acute myeloblastic leukemia, not having achieved remission: Secondary | ICD-10-CM | POA: Diagnosis not present

## 2022-04-09 DIAGNOSIS — R69 Illness, unspecified: Secondary | ICD-10-CM | POA: Diagnosis not present

## 2022-04-09 LAB — PATHOLOGIST SMEAR REVIEW

## 2022-04-10 DIAGNOSIS — G252 Other specified forms of tremor: Secondary | ICD-10-CM | POA: Diagnosis not present

## 2022-04-10 DIAGNOSIS — C9200 Acute myeloblastic leukemia, not having achieved remission: Secondary | ICD-10-CM | POA: Diagnosis not present

## 2022-04-10 DIAGNOSIS — G3184 Mild cognitive impairment, so stated: Secondary | ICD-10-CM | POA: Diagnosis not present

## 2022-04-10 DIAGNOSIS — R69 Illness, unspecified: Secondary | ICD-10-CM | POA: Diagnosis not present

## 2022-04-10 DIAGNOSIS — D709 Neutropenia, unspecified: Secondary | ICD-10-CM | POA: Diagnosis not present

## 2022-04-10 DIAGNOSIS — D696 Thrombocytopenia, unspecified: Secondary | ICD-10-CM | POA: Diagnosis not present

## 2022-04-11 DIAGNOSIS — C9200 Acute myeloblastic leukemia, not having achieved remission: Secondary | ICD-10-CM | POA: Diagnosis not present

## 2022-04-11 DIAGNOSIS — G252 Other specified forms of tremor: Secondary | ICD-10-CM | POA: Diagnosis not present

## 2022-04-11 DIAGNOSIS — D696 Thrombocytopenia, unspecified: Secondary | ICD-10-CM | POA: Diagnosis not present

## 2022-04-11 DIAGNOSIS — R69 Illness, unspecified: Secondary | ICD-10-CM | POA: Diagnosis not present

## 2022-04-11 DIAGNOSIS — G3184 Mild cognitive impairment, so stated: Secondary | ICD-10-CM | POA: Diagnosis not present

## 2022-04-11 DIAGNOSIS — R21 Rash and other nonspecific skin eruption: Secondary | ICD-10-CM | POA: Diagnosis not present

## 2022-04-11 DIAGNOSIS — R0981 Nasal congestion: Secondary | ICD-10-CM | POA: Diagnosis not present

## 2022-04-11 DIAGNOSIS — D709 Neutropenia, unspecified: Secondary | ICD-10-CM | POA: Diagnosis not present

## 2022-04-12 DIAGNOSIS — C9200 Acute myeloblastic leukemia, not having achieved remission: Secondary | ICD-10-CM | POA: Diagnosis not present

## 2022-04-12 DIAGNOSIS — G3184 Mild cognitive impairment, so stated: Secondary | ICD-10-CM | POA: Diagnosis not present

## 2022-04-12 DIAGNOSIS — G252 Other specified forms of tremor: Secondary | ICD-10-CM | POA: Diagnosis not present

## 2022-04-12 DIAGNOSIS — R69 Illness, unspecified: Secondary | ICD-10-CM | POA: Diagnosis not present

## 2022-04-12 DIAGNOSIS — R0981 Nasal congestion: Secondary | ICD-10-CM | POA: Diagnosis not present

## 2022-04-12 DIAGNOSIS — R21 Rash and other nonspecific skin eruption: Secondary | ICD-10-CM | POA: Diagnosis not present

## 2022-04-12 DIAGNOSIS — D696 Thrombocytopenia, unspecified: Secondary | ICD-10-CM | POA: Diagnosis not present

## 2022-04-12 DIAGNOSIS — D709 Neutropenia, unspecified: Secondary | ICD-10-CM | POA: Diagnosis not present

## 2022-04-12 DIAGNOSIS — J3489 Other specified disorders of nose and nasal sinuses: Secondary | ICD-10-CM | POA: Diagnosis not present

## 2022-04-13 DIAGNOSIS — R0981 Nasal congestion: Secondary | ICD-10-CM | POA: Diagnosis not present

## 2022-04-13 DIAGNOSIS — D696 Thrombocytopenia, unspecified: Secondary | ICD-10-CM | POA: Diagnosis not present

## 2022-04-13 DIAGNOSIS — R21 Rash and other nonspecific skin eruption: Secondary | ICD-10-CM | POA: Diagnosis not present

## 2022-04-13 DIAGNOSIS — G3184 Mild cognitive impairment, so stated: Secondary | ICD-10-CM | POA: Diagnosis not present

## 2022-04-13 DIAGNOSIS — G252 Other specified forms of tremor: Secondary | ICD-10-CM | POA: Diagnosis not present

## 2022-04-13 DIAGNOSIS — R69 Illness, unspecified: Secondary | ICD-10-CM | POA: Diagnosis not present

## 2022-04-13 DIAGNOSIS — D709 Neutropenia, unspecified: Secondary | ICD-10-CM | POA: Diagnosis not present

## 2022-04-13 DIAGNOSIS — C9200 Acute myeloblastic leukemia, not having achieved remission: Secondary | ICD-10-CM | POA: Diagnosis not present

## 2022-04-14 DIAGNOSIS — R0981 Nasal congestion: Secondary | ICD-10-CM | POA: Diagnosis not present

## 2022-04-14 DIAGNOSIS — C9200 Acute myeloblastic leukemia, not having achieved remission: Secondary | ICD-10-CM | POA: Diagnosis not present

## 2022-04-14 DIAGNOSIS — R21 Rash and other nonspecific skin eruption: Secondary | ICD-10-CM | POA: Diagnosis not present

## 2022-04-14 DIAGNOSIS — G3184 Mild cognitive impairment, so stated: Secondary | ICD-10-CM | POA: Diagnosis not present

## 2022-04-14 DIAGNOSIS — J810 Acute pulmonary edema: Secondary | ICD-10-CM | POA: Diagnosis not present

## 2022-04-14 DIAGNOSIS — I491 Atrial premature depolarization: Secondary | ICD-10-CM | POA: Diagnosis not present

## 2022-04-14 DIAGNOSIS — G252 Other specified forms of tremor: Secondary | ICD-10-CM | POA: Diagnosis not present

## 2022-04-14 DIAGNOSIS — D696 Thrombocytopenia, unspecified: Secondary | ICD-10-CM | POA: Diagnosis not present

## 2022-04-14 DIAGNOSIS — D709 Neutropenia, unspecified: Secondary | ICD-10-CM | POA: Diagnosis not present

## 2022-04-14 DIAGNOSIS — I517 Cardiomegaly: Secondary | ICD-10-CM | POA: Diagnosis not present

## 2022-04-14 DIAGNOSIS — Z95828 Presence of other vascular implants and grafts: Secondary | ICD-10-CM | POA: Diagnosis not present

## 2022-04-14 DIAGNOSIS — R69 Illness, unspecified: Secondary | ICD-10-CM | POA: Diagnosis not present

## 2022-04-15 DIAGNOSIS — R21 Rash and other nonspecific skin eruption: Secondary | ICD-10-CM | POA: Diagnosis not present

## 2022-04-15 DIAGNOSIS — G3184 Mild cognitive impairment, so stated: Secondary | ICD-10-CM | POA: Diagnosis not present

## 2022-04-15 DIAGNOSIS — D696 Thrombocytopenia, unspecified: Secondary | ICD-10-CM | POA: Diagnosis not present

## 2022-04-15 DIAGNOSIS — R69 Illness, unspecified: Secondary | ICD-10-CM | POA: Diagnosis not present

## 2022-04-15 DIAGNOSIS — C9200 Acute myeloblastic leukemia, not having achieved remission: Secondary | ICD-10-CM | POA: Diagnosis not present

## 2022-04-15 DIAGNOSIS — G252 Other specified forms of tremor: Secondary | ICD-10-CM | POA: Diagnosis not present

## 2022-04-15 DIAGNOSIS — R0981 Nasal congestion: Secondary | ICD-10-CM | POA: Diagnosis not present

## 2022-04-15 DIAGNOSIS — D709 Neutropenia, unspecified: Secondary | ICD-10-CM | POA: Diagnosis not present

## 2022-04-16 DIAGNOSIS — C9200 Acute myeloblastic leukemia, not having achieved remission: Secondary | ICD-10-CM | POA: Diagnosis not present

## 2022-04-16 DIAGNOSIS — R69 Illness, unspecified: Secondary | ICD-10-CM | POA: Diagnosis not present

## 2022-04-16 DIAGNOSIS — R0981 Nasal congestion: Secondary | ICD-10-CM | POA: Diagnosis not present

## 2022-04-16 DIAGNOSIS — R21 Rash and other nonspecific skin eruption: Secondary | ICD-10-CM | POA: Diagnosis not present

## 2022-04-16 DIAGNOSIS — R001 Bradycardia, unspecified: Secondary | ICD-10-CM | POA: Diagnosis not present

## 2022-04-16 DIAGNOSIS — R002 Palpitations: Secondary | ICD-10-CM | POA: Diagnosis not present

## 2022-04-16 DIAGNOSIS — D709 Neutropenia, unspecified: Secondary | ICD-10-CM | POA: Diagnosis not present

## 2022-04-16 DIAGNOSIS — G252 Other specified forms of tremor: Secondary | ICD-10-CM | POA: Diagnosis not present

## 2022-04-16 DIAGNOSIS — D696 Thrombocytopenia, unspecified: Secondary | ICD-10-CM | POA: Diagnosis not present

## 2022-04-16 DIAGNOSIS — R609 Edema, unspecified: Secondary | ICD-10-CM | POA: Diagnosis not present

## 2022-04-16 DIAGNOSIS — G3184 Mild cognitive impairment, so stated: Secondary | ICD-10-CM | POA: Diagnosis not present

## 2022-04-17 DIAGNOSIS — R21 Rash and other nonspecific skin eruption: Secondary | ICD-10-CM | POA: Diagnosis not present

## 2022-04-17 DIAGNOSIS — R0981 Nasal congestion: Secondary | ICD-10-CM | POA: Diagnosis not present

## 2022-04-17 DIAGNOSIS — C9200 Acute myeloblastic leukemia, not having achieved remission: Secondary | ICD-10-CM | POA: Diagnosis not present

## 2022-04-17 DIAGNOSIS — G3184 Mild cognitive impairment, so stated: Secondary | ICD-10-CM | POA: Diagnosis not present

## 2022-04-17 DIAGNOSIS — D696 Thrombocytopenia, unspecified: Secondary | ICD-10-CM | POA: Diagnosis not present

## 2022-04-17 DIAGNOSIS — R69 Illness, unspecified: Secondary | ICD-10-CM | POA: Diagnosis not present

## 2022-04-17 DIAGNOSIS — D709 Neutropenia, unspecified: Secondary | ICD-10-CM | POA: Diagnosis not present

## 2022-04-17 DIAGNOSIS — G252 Other specified forms of tremor: Secondary | ICD-10-CM | POA: Diagnosis not present

## 2022-04-18 DIAGNOSIS — R69 Illness, unspecified: Secondary | ICD-10-CM | POA: Diagnosis not present

## 2022-04-18 DIAGNOSIS — D709 Neutropenia, unspecified: Secondary | ICD-10-CM | POA: Diagnosis not present

## 2022-04-18 DIAGNOSIS — R0981 Nasal congestion: Secondary | ICD-10-CM | POA: Diagnosis not present

## 2022-04-18 DIAGNOSIS — C9200 Acute myeloblastic leukemia, not having achieved remission: Secondary | ICD-10-CM | POA: Diagnosis not present

## 2022-04-18 DIAGNOSIS — G252 Other specified forms of tremor: Secondary | ICD-10-CM | POA: Diagnosis not present

## 2022-04-18 DIAGNOSIS — R21 Rash and other nonspecific skin eruption: Secondary | ICD-10-CM | POA: Diagnosis not present

## 2022-04-18 DIAGNOSIS — D696 Thrombocytopenia, unspecified: Secondary | ICD-10-CM | POA: Diagnosis not present

## 2022-04-18 DIAGNOSIS — G3184 Mild cognitive impairment, so stated: Secondary | ICD-10-CM | POA: Diagnosis not present

## 2022-04-19 DIAGNOSIS — G3184 Mild cognitive impairment, so stated: Secondary | ICD-10-CM | POA: Diagnosis not present

## 2022-04-19 DIAGNOSIS — R21 Rash and other nonspecific skin eruption: Secondary | ICD-10-CM | POA: Diagnosis not present

## 2022-04-19 DIAGNOSIS — D696 Thrombocytopenia, unspecified: Secondary | ICD-10-CM | POA: Diagnosis not present

## 2022-04-19 DIAGNOSIS — R0981 Nasal congestion: Secondary | ICD-10-CM | POA: Diagnosis not present

## 2022-04-19 DIAGNOSIS — D709 Neutropenia, unspecified: Secondary | ICD-10-CM | POA: Diagnosis not present

## 2022-04-19 DIAGNOSIS — G252 Other specified forms of tremor: Secondary | ICD-10-CM | POA: Diagnosis not present

## 2022-04-19 DIAGNOSIS — R69 Illness, unspecified: Secondary | ICD-10-CM | POA: Diagnosis not present

## 2022-04-19 DIAGNOSIS — C9200 Acute myeloblastic leukemia, not having achieved remission: Secondary | ICD-10-CM | POA: Diagnosis not present

## 2022-04-20 DIAGNOSIS — R0981 Nasal congestion: Secondary | ICD-10-CM | POA: Diagnosis not present

## 2022-04-20 DIAGNOSIS — R21 Rash and other nonspecific skin eruption: Secondary | ICD-10-CM | POA: Diagnosis not present

## 2022-04-20 DIAGNOSIS — C9200 Acute myeloblastic leukemia, not having achieved remission: Secondary | ICD-10-CM | POA: Diagnosis not present

## 2022-04-20 DIAGNOSIS — D696 Thrombocytopenia, unspecified: Secondary | ICD-10-CM | POA: Diagnosis not present

## 2022-04-20 DIAGNOSIS — R69 Illness, unspecified: Secondary | ICD-10-CM | POA: Diagnosis not present

## 2022-04-20 DIAGNOSIS — G3184 Mild cognitive impairment, so stated: Secondary | ICD-10-CM | POA: Diagnosis not present

## 2022-04-20 DIAGNOSIS — G252 Other specified forms of tremor: Secondary | ICD-10-CM | POA: Diagnosis not present

## 2022-04-20 DIAGNOSIS — D709 Neutropenia, unspecified: Secondary | ICD-10-CM | POA: Diagnosis not present

## 2022-04-21 DIAGNOSIS — R21 Rash and other nonspecific skin eruption: Secondary | ICD-10-CM | POA: Diagnosis not present

## 2022-04-21 DIAGNOSIS — G252 Other specified forms of tremor: Secondary | ICD-10-CM | POA: Diagnosis not present

## 2022-04-21 DIAGNOSIS — G250 Essential tremor: Secondary | ICD-10-CM | POA: Diagnosis not present

## 2022-04-21 DIAGNOSIS — G3184 Mild cognitive impairment, so stated: Secondary | ICD-10-CM | POA: Diagnosis not present

## 2022-04-21 DIAGNOSIS — C9200 Acute myeloblastic leukemia, not having achieved remission: Secondary | ICD-10-CM | POA: Diagnosis not present

## 2022-04-21 DIAGNOSIS — C9500 Acute leukemia of unspecified cell type not having achieved remission: Secondary | ICD-10-CM | POA: Diagnosis not present

## 2022-04-21 DIAGNOSIS — I9589 Other hypotension: Secondary | ICD-10-CM | POA: Diagnosis not present

## 2022-04-21 DIAGNOSIS — J810 Acute pulmonary edema: Secondary | ICD-10-CM | POA: Diagnosis not present

## 2022-04-21 DIAGNOSIS — D696 Thrombocytopenia, unspecified: Secondary | ICD-10-CM | POA: Diagnosis not present

## 2022-04-21 DIAGNOSIS — D709 Neutropenia, unspecified: Secondary | ICD-10-CM | POA: Diagnosis not present

## 2022-04-21 DIAGNOSIS — I499 Cardiac arrhythmia, unspecified: Secondary | ICD-10-CM | POA: Diagnosis not present

## 2022-04-21 DIAGNOSIS — I491 Atrial premature depolarization: Secondary | ICD-10-CM | POA: Diagnosis not present

## 2022-04-21 DIAGNOSIS — R0981 Nasal congestion: Secondary | ICD-10-CM | POA: Diagnosis not present

## 2022-04-21 DIAGNOSIS — I272 Pulmonary hypertension, unspecified: Secondary | ICD-10-CM | POA: Diagnosis not present

## 2022-04-21 DIAGNOSIS — R69 Illness, unspecified: Secondary | ICD-10-CM | POA: Diagnosis not present

## 2022-04-21 DIAGNOSIS — R0902 Hypoxemia: Secondary | ICD-10-CM | POA: Diagnosis not present

## 2022-04-21 DIAGNOSIS — J90 Pleural effusion, not elsewhere classified: Secondary | ICD-10-CM | POA: Diagnosis not present

## 2022-04-22 DIAGNOSIS — C9200 Acute myeloblastic leukemia, not having achieved remission: Secondary | ICD-10-CM | POA: Diagnosis not present

## 2022-04-22 DIAGNOSIS — R69 Illness, unspecified: Secondary | ICD-10-CM | POA: Diagnosis not present

## 2022-04-22 DIAGNOSIS — C9500 Acute leukemia of unspecified cell type not having achieved remission: Secondary | ICD-10-CM | POA: Diagnosis not present

## 2022-04-22 DIAGNOSIS — I272 Pulmonary hypertension, unspecified: Secondary | ICD-10-CM | POA: Diagnosis not present

## 2022-04-22 DIAGNOSIS — D61818 Other pancytopenia: Secondary | ICD-10-CM | POA: Diagnosis not present

## 2022-04-22 DIAGNOSIS — G250 Essential tremor: Secondary | ICD-10-CM | POA: Diagnosis not present

## 2022-04-22 DIAGNOSIS — R197 Diarrhea, unspecified: Secondary | ICD-10-CM | POA: Diagnosis not present

## 2022-04-23 DIAGNOSIS — C9500 Acute leukemia of unspecified cell type not having achieved remission: Secondary | ICD-10-CM | POA: Diagnosis not present

## 2022-04-24 DIAGNOSIS — C9200 Acute myeloblastic leukemia, not having achieved remission: Secondary | ICD-10-CM | POA: Diagnosis not present

## 2022-04-24 DIAGNOSIS — I491 Atrial premature depolarization: Secondary | ICD-10-CM | POA: Diagnosis not present

## 2022-04-24 DIAGNOSIS — C9500 Acute leukemia of unspecified cell type not having achieved remission: Secondary | ICD-10-CM | POA: Diagnosis not present

## 2022-04-25 DIAGNOSIS — J810 Acute pulmonary edema: Secondary | ICD-10-CM | POA: Diagnosis not present

## 2022-04-25 DIAGNOSIS — C9200 Acute myeloblastic leukemia, not having achieved remission: Secondary | ICD-10-CM | POA: Diagnosis not present

## 2022-04-25 DIAGNOSIS — D61810 Antineoplastic chemotherapy induced pancytopenia: Secondary | ICD-10-CM | POA: Diagnosis not present

## 2022-04-25 DIAGNOSIS — F32A Depression, unspecified: Secondary | ICD-10-CM | POA: Diagnosis not present

## 2022-04-25 DIAGNOSIS — R2243 Localized swelling, mass and lump, lower limb, bilateral: Secondary | ICD-10-CM | POA: Diagnosis not present

## 2022-04-25 DIAGNOSIS — D709 Neutropenia, unspecified: Secondary | ICD-10-CM | POA: Diagnosis not present

## 2022-04-25 DIAGNOSIS — D61818 Other pancytopenia: Secondary | ICD-10-CM | POA: Diagnosis not present

## 2022-04-25 DIAGNOSIS — E041 Nontoxic single thyroid nodule: Secondary | ICD-10-CM | POA: Diagnosis not present

## 2022-04-25 DIAGNOSIS — R21 Rash and other nonspecific skin eruption: Secondary | ICD-10-CM | POA: Diagnosis not present

## 2022-04-25 DIAGNOSIS — E8770 Fluid overload, unspecified: Secondary | ICD-10-CM | POA: Diagnosis not present

## 2022-04-25 DIAGNOSIS — I270 Primary pulmonary hypertension: Secondary | ICD-10-CM | POA: Diagnosis not present

## 2022-04-25 DIAGNOSIS — R001 Bradycardia, unspecified: Secondary | ICD-10-CM | POA: Diagnosis not present

## 2022-04-26 DIAGNOSIS — D61818 Other pancytopenia: Secondary | ICD-10-CM | POA: Diagnosis not present

## 2022-04-26 DIAGNOSIS — J810 Acute pulmonary edema: Secondary | ICD-10-CM | POA: Diagnosis not present

## 2022-04-26 DIAGNOSIS — E041 Nontoxic single thyroid nodule: Secondary | ICD-10-CM | POA: Diagnosis not present

## 2022-04-26 DIAGNOSIS — D61810 Antineoplastic chemotherapy induced pancytopenia: Secondary | ICD-10-CM | POA: Diagnosis not present

## 2022-04-26 DIAGNOSIS — F32A Depression, unspecified: Secondary | ICD-10-CM | POA: Diagnosis not present

## 2022-04-26 DIAGNOSIS — R001 Bradycardia, unspecified: Secondary | ICD-10-CM | POA: Diagnosis not present

## 2022-04-26 DIAGNOSIS — E8770 Fluid overload, unspecified: Secondary | ICD-10-CM | POA: Diagnosis not present

## 2022-04-26 DIAGNOSIS — I270 Primary pulmonary hypertension: Secondary | ICD-10-CM | POA: Diagnosis not present

## 2022-04-26 DIAGNOSIS — R21 Rash and other nonspecific skin eruption: Secondary | ICD-10-CM | POA: Diagnosis not present

## 2022-04-26 DIAGNOSIS — C9200 Acute myeloblastic leukemia, not having achieved remission: Secondary | ICD-10-CM | POA: Diagnosis not present

## 2022-04-26 DIAGNOSIS — R2243 Localized swelling, mass and lump, lower limb, bilateral: Secondary | ICD-10-CM | POA: Diagnosis not present

## 2022-04-26 DIAGNOSIS — D709 Neutropenia, unspecified: Secondary | ICD-10-CM | POA: Diagnosis not present

## 2022-04-27 DIAGNOSIS — J810 Acute pulmonary edema: Secondary | ICD-10-CM | POA: Diagnosis not present

## 2022-04-27 DIAGNOSIS — E041 Nontoxic single thyroid nodule: Secondary | ICD-10-CM | POA: Diagnosis not present

## 2022-04-27 DIAGNOSIS — C9200 Acute myeloblastic leukemia, not having achieved remission: Secondary | ICD-10-CM | POA: Diagnosis not present

## 2022-04-27 DIAGNOSIS — R21 Rash and other nonspecific skin eruption: Secondary | ICD-10-CM | POA: Diagnosis not present

## 2022-04-27 DIAGNOSIS — I270 Primary pulmonary hypertension: Secondary | ICD-10-CM | POA: Diagnosis not present

## 2022-04-27 DIAGNOSIS — D61818 Other pancytopenia: Secondary | ICD-10-CM | POA: Diagnosis not present

## 2022-04-27 DIAGNOSIS — D61810 Antineoplastic chemotherapy induced pancytopenia: Secondary | ICD-10-CM | POA: Diagnosis not present

## 2022-04-27 DIAGNOSIS — E8770 Fluid overload, unspecified: Secondary | ICD-10-CM | POA: Diagnosis not present

## 2022-04-27 DIAGNOSIS — F32A Depression, unspecified: Secondary | ICD-10-CM | POA: Diagnosis not present

## 2022-04-27 DIAGNOSIS — R001 Bradycardia, unspecified: Secondary | ICD-10-CM | POA: Diagnosis not present

## 2022-04-27 DIAGNOSIS — R2243 Localized swelling, mass and lump, lower limb, bilateral: Secondary | ICD-10-CM | POA: Diagnosis not present

## 2022-04-27 DIAGNOSIS — D709 Neutropenia, unspecified: Secondary | ICD-10-CM | POA: Diagnosis not present

## 2022-04-28 DIAGNOSIS — F32A Depression, unspecified: Secondary | ICD-10-CM | POA: Diagnosis not present

## 2022-04-28 DIAGNOSIS — R2243 Localized swelling, mass and lump, lower limb, bilateral: Secondary | ICD-10-CM | POA: Diagnosis not present

## 2022-04-28 DIAGNOSIS — J810 Acute pulmonary edema: Secondary | ICD-10-CM | POA: Diagnosis not present

## 2022-04-28 DIAGNOSIS — D61818 Other pancytopenia: Secondary | ICD-10-CM | POA: Diagnosis not present

## 2022-04-28 DIAGNOSIS — R001 Bradycardia, unspecified: Secondary | ICD-10-CM | POA: Diagnosis not present

## 2022-04-28 DIAGNOSIS — I270 Primary pulmonary hypertension: Secondary | ICD-10-CM | POA: Diagnosis not present

## 2022-04-28 DIAGNOSIS — D61810 Antineoplastic chemotherapy induced pancytopenia: Secondary | ICD-10-CM | POA: Diagnosis not present

## 2022-04-28 DIAGNOSIS — C9200 Acute myeloblastic leukemia, not having achieved remission: Secondary | ICD-10-CM | POA: Diagnosis not present

## 2022-04-28 DIAGNOSIS — E8770 Fluid overload, unspecified: Secondary | ICD-10-CM | POA: Diagnosis not present

## 2022-04-28 DIAGNOSIS — R21 Rash and other nonspecific skin eruption: Secondary | ICD-10-CM | POA: Diagnosis not present

## 2022-04-28 DIAGNOSIS — E041 Nontoxic single thyroid nodule: Secondary | ICD-10-CM | POA: Diagnosis not present

## 2022-04-28 DIAGNOSIS — D709 Neutropenia, unspecified: Secondary | ICD-10-CM | POA: Diagnosis not present

## 2022-04-29 DIAGNOSIS — E041 Nontoxic single thyroid nodule: Secondary | ICD-10-CM | POA: Diagnosis not present

## 2022-04-29 DIAGNOSIS — E8770 Fluid overload, unspecified: Secondary | ICD-10-CM | POA: Diagnosis not present

## 2022-04-29 DIAGNOSIS — C9200 Acute myeloblastic leukemia, not having achieved remission: Secondary | ICD-10-CM | POA: Diagnosis not present

## 2022-04-29 DIAGNOSIS — D61818 Other pancytopenia: Secondary | ICD-10-CM | POA: Diagnosis not present

## 2022-04-29 DIAGNOSIS — J810 Acute pulmonary edema: Secondary | ICD-10-CM | POA: Diagnosis not present

## 2022-04-29 DIAGNOSIS — R21 Rash and other nonspecific skin eruption: Secondary | ICD-10-CM | POA: Diagnosis not present

## 2022-04-29 DIAGNOSIS — D709 Neutropenia, unspecified: Secondary | ICD-10-CM | POA: Diagnosis not present

## 2022-04-29 DIAGNOSIS — R001 Bradycardia, unspecified: Secondary | ICD-10-CM | POA: Diagnosis not present

## 2022-04-29 DIAGNOSIS — I270 Primary pulmonary hypertension: Secondary | ICD-10-CM | POA: Diagnosis not present

## 2022-04-29 DIAGNOSIS — D61810 Antineoplastic chemotherapy induced pancytopenia: Secondary | ICD-10-CM | POA: Diagnosis not present

## 2022-04-29 DIAGNOSIS — F32A Depression, unspecified: Secondary | ICD-10-CM | POA: Diagnosis not present

## 2022-04-29 DIAGNOSIS — R2243 Localized swelling, mass and lump, lower limb, bilateral: Secondary | ICD-10-CM | POA: Diagnosis not present

## 2022-04-30 DIAGNOSIS — C9200 Acute myeloblastic leukemia, not having achieved remission: Secondary | ICD-10-CM | POA: Diagnosis not present

## 2022-04-30 DIAGNOSIS — D61810 Antineoplastic chemotherapy induced pancytopenia: Secondary | ICD-10-CM | POA: Diagnosis not present

## 2022-04-30 DIAGNOSIS — E8770 Fluid overload, unspecified: Secondary | ICD-10-CM | POA: Diagnosis not present

## 2022-04-30 DIAGNOSIS — D709 Neutropenia, unspecified: Secondary | ICD-10-CM | POA: Diagnosis not present

## 2022-04-30 DIAGNOSIS — D61818 Other pancytopenia: Secondary | ICD-10-CM | POA: Diagnosis not present

## 2022-04-30 DIAGNOSIS — J810 Acute pulmonary edema: Secondary | ICD-10-CM | POA: Diagnosis not present

## 2022-04-30 DIAGNOSIS — R21 Rash and other nonspecific skin eruption: Secondary | ICD-10-CM | POA: Diagnosis not present

## 2022-04-30 DIAGNOSIS — F32A Depression, unspecified: Secondary | ICD-10-CM | POA: Diagnosis not present

## 2022-04-30 DIAGNOSIS — I270 Primary pulmonary hypertension: Secondary | ICD-10-CM | POA: Diagnosis not present

## 2022-04-30 DIAGNOSIS — R001 Bradycardia, unspecified: Secondary | ICD-10-CM | POA: Diagnosis not present

## 2022-04-30 DIAGNOSIS — R2243 Localized swelling, mass and lump, lower limb, bilateral: Secondary | ICD-10-CM | POA: Diagnosis not present

## 2022-04-30 DIAGNOSIS — E041 Nontoxic single thyroid nodule: Secondary | ICD-10-CM | POA: Diagnosis not present

## 2022-04-30 DIAGNOSIS — I491 Atrial premature depolarization: Secondary | ICD-10-CM | POA: Diagnosis not present

## 2022-05-01 DIAGNOSIS — E041 Nontoxic single thyroid nodule: Secondary | ICD-10-CM | POA: Diagnosis not present

## 2022-05-01 DIAGNOSIS — E8770 Fluid overload, unspecified: Secondary | ICD-10-CM | POA: Diagnosis not present

## 2022-05-01 DIAGNOSIS — D61818 Other pancytopenia: Secondary | ICD-10-CM | POA: Diagnosis not present

## 2022-05-01 DIAGNOSIS — F32A Depression, unspecified: Secondary | ICD-10-CM | POA: Diagnosis not present

## 2022-05-01 DIAGNOSIS — D709 Neutropenia, unspecified: Secondary | ICD-10-CM | POA: Diagnosis not present

## 2022-05-01 DIAGNOSIS — R21 Rash and other nonspecific skin eruption: Secondary | ICD-10-CM | POA: Diagnosis not present

## 2022-05-01 DIAGNOSIS — R001 Bradycardia, unspecified: Secondary | ICD-10-CM | POA: Diagnosis not present

## 2022-05-01 DIAGNOSIS — R2243 Localized swelling, mass and lump, lower limb, bilateral: Secondary | ICD-10-CM | POA: Diagnosis not present

## 2022-05-01 DIAGNOSIS — I270 Primary pulmonary hypertension: Secondary | ICD-10-CM | POA: Diagnosis not present

## 2022-05-01 DIAGNOSIS — J810 Acute pulmonary edema: Secondary | ICD-10-CM | POA: Diagnosis not present

## 2022-05-01 DIAGNOSIS — D61810 Antineoplastic chemotherapy induced pancytopenia: Secondary | ICD-10-CM | POA: Diagnosis not present

## 2022-05-01 DIAGNOSIS — C9200 Acute myeloblastic leukemia, not having achieved remission: Secondary | ICD-10-CM | POA: Diagnosis not present

## 2022-05-02 DIAGNOSIS — C9200 Acute myeloblastic leukemia, not having achieved remission: Secondary | ICD-10-CM | POA: Diagnosis not present

## 2022-05-03 DIAGNOSIS — C9200 Acute myeloblastic leukemia, not having achieved remission: Secondary | ICD-10-CM | POA: Diagnosis not present

## 2022-05-03 DIAGNOSIS — I82612 Acute embolism and thrombosis of superficial veins of left upper extremity: Secondary | ICD-10-CM | POA: Diagnosis not present

## 2022-05-04 DIAGNOSIS — C9200 Acute myeloblastic leukemia, not having achieved remission: Secondary | ICD-10-CM | POA: Diagnosis not present

## 2022-05-05 DIAGNOSIS — C9200 Acute myeloblastic leukemia, not having achieved remission: Secondary | ICD-10-CM | POA: Diagnosis not present

## 2022-05-07 ENCOUNTER — Other Ambulatory Visit: Payer: Self-pay | Admitting: Neurology

## 2022-05-09 DIAGNOSIS — C9200 Acute myeloblastic leukemia, not having achieved remission: Secondary | ICD-10-CM | POA: Diagnosis not present

## 2022-05-09 DIAGNOSIS — E663 Overweight: Secondary | ICD-10-CM | POA: Diagnosis not present

## 2022-05-09 DIAGNOSIS — Z6826 Body mass index (BMI) 26.0-26.9, adult: Secondary | ICD-10-CM | POA: Diagnosis not present

## 2022-05-09 NOTE — Telephone Encounter (Signed)
Requested Prescriptions   Pending Prescriptions Disp Refills   memantine (NAMENDA) 10 MG tablet [Pharmacy Med Name: MEMANTINE HCL 10 MG TAB] 60 tablet 11    Sig: TAKE ONE TABLET (10MG  TOTAL) BY MOUTH TWO TIMES DAILY   donepezil (ARICEPT) 10 MG tablet [Pharmacy Med Name: DONEPEZIL HCL 10 MG TAB] 30 tablet 11    Sig: TAKE ONE TABLET (10MG  TOTAL) BY MOUTH ATBEDTIME   [facility] DISCONTINUED THE ARICEPT  Will file in chart as: donepezil (ARICEPT) 10 MG tablet    The original prescription was discontinued on 04/07/2022 by [place] Eric J, DO for the following reason: Stop Taking at Discharge. Renewing this prescription may not be appropriate.   Sig: TAKE ONE TABLET (10MG  TOTAL) BY    PT IS OVERDUE FOR AN APPOINMENT  IN THE FIRST PLACE SINCE LAST SEEN 04/27/21. ROUTING TO DR. YAN TO SEE IF SHE DOES WANT TO REFILL SINCE HOSPITALIST INSTRUCTED THE PT TO D/C AT DISCHARGE. ALSO TO ASK IF SHE WOULD BE OK GIVING ONE MONTH SUPPLY AND STATING ON THE BOTTLE APPOINTMENT NEEDED FOR FURTHER REFILLS

## 2022-05-10 DIAGNOSIS — C9201 Acute myeloblastic leukemia, in remission: Secondary | ICD-10-CM | POA: Diagnosis not present

## 2022-05-10 DIAGNOSIS — R7989 Other specified abnormal findings of blood chemistry: Secondary | ICD-10-CM | POA: Diagnosis not present

## 2022-05-10 DIAGNOSIS — D61818 Other pancytopenia: Secondary | ICD-10-CM | POA: Diagnosis not present

## 2022-05-14 DIAGNOSIS — I10 Essential (primary) hypertension: Secondary | ICD-10-CM | POA: Diagnosis not present

## 2022-05-14 DIAGNOSIS — R197 Diarrhea, unspecified: Secondary | ICD-10-CM | POA: Diagnosis not present

## 2022-05-14 DIAGNOSIS — R3911 Hesitancy of micturition: Secondary | ICD-10-CM | POA: Diagnosis not present

## 2022-05-14 DIAGNOSIS — R309 Painful micturition, unspecified: Secondary | ICD-10-CM | POA: Diagnosis not present

## 2022-05-14 DIAGNOSIS — C9200 Acute myeloblastic leukemia, not having achieved remission: Secondary | ICD-10-CM | POA: Diagnosis not present

## 2022-05-17 DIAGNOSIS — C9200 Acute myeloblastic leukemia, not having achieved remission: Secondary | ICD-10-CM | POA: Diagnosis not present

## 2022-05-28 DIAGNOSIS — Z006 Encounter for examination for normal comparison and control in clinical research program: Secondary | ICD-10-CM | POA: Diagnosis not present

## 2022-05-28 DIAGNOSIS — G3184 Mild cognitive impairment, so stated: Secondary | ICD-10-CM | POA: Diagnosis not present

## 2022-05-28 DIAGNOSIS — R040 Epistaxis: Secondary | ICD-10-CM | POA: Diagnosis not present

## 2022-05-28 DIAGNOSIS — Z23 Encounter for immunization: Secondary | ICD-10-CM | POA: Diagnosis not present

## 2022-05-28 DIAGNOSIS — Z5111 Encounter for antineoplastic chemotherapy: Secondary | ICD-10-CM | POA: Diagnosis not present

## 2022-05-28 DIAGNOSIS — G4700 Insomnia, unspecified: Secondary | ICD-10-CM | POA: Diagnosis not present

## 2022-05-28 DIAGNOSIS — F419 Anxiety disorder, unspecified: Secondary | ICD-10-CM | POA: Diagnosis not present

## 2022-05-28 DIAGNOSIS — I10 Essential (primary) hypertension: Secondary | ICD-10-CM | POA: Diagnosis not present

## 2022-05-28 DIAGNOSIS — C9201 Acute myeloblastic leukemia, in remission: Secondary | ICD-10-CM | POA: Diagnosis not present

## 2022-05-28 DIAGNOSIS — D649 Anemia, unspecified: Secondary | ICD-10-CM | POA: Diagnosis not present

## 2022-05-28 DIAGNOSIS — G250 Essential tremor: Secondary | ICD-10-CM | POA: Diagnosis not present

## 2022-05-28 DIAGNOSIS — G252 Other specified forms of tremor: Secondary | ICD-10-CM | POA: Diagnosis not present

## 2022-05-28 DIAGNOSIS — I272 Pulmonary hypertension, unspecified: Secondary | ICD-10-CM | POA: Diagnosis not present

## 2022-05-28 DIAGNOSIS — K5900 Constipation, unspecified: Secondary | ICD-10-CM | POA: Diagnosis not present

## 2022-05-28 DIAGNOSIS — Z79899 Other long term (current) drug therapy: Secondary | ICD-10-CM | POA: Diagnosis not present

## 2022-05-28 DIAGNOSIS — F32A Depression, unspecified: Secondary | ICD-10-CM | POA: Diagnosis not present

## 2022-05-28 DIAGNOSIS — R258 Other abnormal involuntary movements: Secondary | ICD-10-CM | POA: Diagnosis not present

## 2022-05-28 DIAGNOSIS — D696 Thrombocytopenia, unspecified: Secondary | ICD-10-CM | POA: Diagnosis not present

## 2022-05-28 DIAGNOSIS — R509 Fever, unspecified: Secondary | ICD-10-CM | POA: Diagnosis not present

## 2022-05-28 DIAGNOSIS — C9200 Acute myeloblastic leukemia, not having achieved remission: Secondary | ICD-10-CM | POA: Diagnosis not present

## 2022-05-29 DIAGNOSIS — R258 Other abnormal involuntary movements: Secondary | ICD-10-CM | POA: Diagnosis not present

## 2022-05-29 DIAGNOSIS — D696 Thrombocytopenia, unspecified: Secondary | ICD-10-CM | POA: Diagnosis not present

## 2022-05-29 DIAGNOSIS — F32A Depression, unspecified: Secondary | ICD-10-CM | POA: Diagnosis not present

## 2022-05-29 DIAGNOSIS — C9200 Acute myeloblastic leukemia, not having achieved remission: Secondary | ICD-10-CM | POA: Diagnosis not present

## 2022-05-30 DIAGNOSIS — C9201 Acute myeloblastic leukemia, in remission: Secondary | ICD-10-CM | POA: Diagnosis not present

## 2022-05-30 DIAGNOSIS — D696 Thrombocytopenia, unspecified: Secondary | ICD-10-CM | POA: Diagnosis not present

## 2022-05-30 DIAGNOSIS — F32A Depression, unspecified: Secondary | ICD-10-CM | POA: Diagnosis not present

## 2022-05-31 DIAGNOSIS — Z006 Encounter for examination for normal comparison and control in clinical research program: Secondary | ICD-10-CM | POA: Diagnosis not present

## 2022-05-31 DIAGNOSIS — C9201 Acute myeloblastic leukemia, in remission: Secondary | ICD-10-CM | POA: Diagnosis not present

## 2022-06-01 DIAGNOSIS — R7989 Other specified abnormal findings of blood chemistry: Secondary | ICD-10-CM | POA: Diagnosis not present

## 2022-06-01 DIAGNOSIS — D61818 Other pancytopenia: Secondary | ICD-10-CM | POA: Diagnosis not present

## 2022-06-01 DIAGNOSIS — C9201 Acute myeloblastic leukemia, in remission: Secondary | ICD-10-CM | POA: Diagnosis not present

## 2022-06-04 DIAGNOSIS — D61818 Other pancytopenia: Secondary | ICD-10-CM | POA: Diagnosis not present

## 2022-06-04 DIAGNOSIS — R7989 Other specified abnormal findings of blood chemistry: Secondary | ICD-10-CM | POA: Diagnosis not present

## 2022-06-04 DIAGNOSIS — Z006 Encounter for examination for normal comparison and control in clinical research program: Secondary | ICD-10-CM | POA: Diagnosis not present

## 2022-06-04 DIAGNOSIS — C9201 Acute myeloblastic leukemia, in remission: Secondary | ICD-10-CM | POA: Diagnosis not present

## 2022-06-07 DIAGNOSIS — C9200 Acute myeloblastic leukemia, not having achieved remission: Secondary | ICD-10-CM | POA: Diagnosis not present

## 2022-06-10 DIAGNOSIS — Z6825 Body mass index (BMI) 25.0-25.9, adult: Secondary | ICD-10-CM | POA: Diagnosis not present

## 2022-06-10 DIAGNOSIS — D61810 Antineoplastic chemotherapy induced pancytopenia: Secondary | ICD-10-CM | POA: Diagnosis not present

## 2022-06-10 DIAGNOSIS — I491 Atrial premature depolarization: Secondary | ICD-10-CM | POA: Diagnosis not present

## 2022-06-10 DIAGNOSIS — I080 Rheumatic disorders of both mitral and aortic valves: Secondary | ICD-10-CM | POA: Diagnosis not present

## 2022-06-10 DIAGNOSIS — T451X5A Adverse effect of antineoplastic and immunosuppressive drugs, initial encounter: Secondary | ICD-10-CM | POA: Diagnosis not present

## 2022-06-10 DIAGNOSIS — R4182 Altered mental status, unspecified: Secondary | ICD-10-CM | POA: Diagnosis not present

## 2022-06-10 DIAGNOSIS — E860 Dehydration: Secondary | ICD-10-CM | POA: Diagnosis not present

## 2022-06-10 DIAGNOSIS — E43 Unspecified severe protein-calorie malnutrition: Secondary | ICD-10-CM | POA: Diagnosis not present

## 2022-06-10 DIAGNOSIS — J9811 Atelectasis: Secondary | ICD-10-CM | POA: Diagnosis not present

## 2022-06-10 DIAGNOSIS — G250 Essential tremor: Secondary | ICD-10-CM | POA: Diagnosis not present

## 2022-06-10 DIAGNOSIS — I10 Essential (primary) hypertension: Secondary | ICD-10-CM | POA: Diagnosis not present

## 2022-06-10 DIAGNOSIS — R001 Bradycardia, unspecified: Secondary | ICD-10-CM | POA: Diagnosis not present

## 2022-06-10 DIAGNOSIS — I951 Orthostatic hypotension: Secondary | ICD-10-CM | POA: Diagnosis not present

## 2022-06-10 DIAGNOSIS — C9200 Acute myeloblastic leukemia, not having achieved remission: Secondary | ICD-10-CM | POA: Diagnosis not present

## 2022-06-10 DIAGNOSIS — F32A Depression, unspecified: Secondary | ICD-10-CM | POA: Diagnosis not present

## 2022-06-10 DIAGNOSIS — G3184 Mild cognitive impairment, so stated: Secondary | ICD-10-CM | POA: Diagnosis not present

## 2022-06-10 DIAGNOSIS — E869 Volume depletion, unspecified: Secondary | ICD-10-CM | POA: Diagnosis not present

## 2022-06-10 DIAGNOSIS — R55 Syncope and collapse: Secondary | ICD-10-CM | POA: Diagnosis not present

## 2022-06-11 DIAGNOSIS — R55 Syncope and collapse: Secondary | ICD-10-CM | POA: Diagnosis not present

## 2022-06-11 DIAGNOSIS — C9200 Acute myeloblastic leukemia, not having achieved remission: Secondary | ICD-10-CM | POA: Diagnosis not present

## 2022-06-11 DIAGNOSIS — D61810 Antineoplastic chemotherapy induced pancytopenia: Secondary | ICD-10-CM | POA: Diagnosis not present

## 2022-06-12 DIAGNOSIS — I491 Atrial premature depolarization: Secondary | ICD-10-CM | POA: Diagnosis not present

## 2022-06-12 DIAGNOSIS — C9200 Acute myeloblastic leukemia, not having achieved remission: Secondary | ICD-10-CM | POA: Diagnosis not present

## 2022-06-12 DIAGNOSIS — D61810 Antineoplastic chemotherapy induced pancytopenia: Secondary | ICD-10-CM | POA: Diagnosis not present

## 2022-06-12 DIAGNOSIS — I499 Cardiac arrhythmia, unspecified: Secondary | ICD-10-CM | POA: Diagnosis not present

## 2022-06-12 DIAGNOSIS — I080 Rheumatic disorders of both mitral and aortic valves: Secondary | ICD-10-CM | POA: Diagnosis not present

## 2022-06-12 DIAGNOSIS — R55 Syncope and collapse: Secondary | ICD-10-CM | POA: Diagnosis not present

## 2022-06-22 DIAGNOSIS — G4733 Obstructive sleep apnea (adult) (pediatric): Secondary | ICD-10-CM | POA: Diagnosis not present

## 2022-07-09 DIAGNOSIS — Z79899 Other long term (current) drug therapy: Secondary | ICD-10-CM | POA: Diagnosis not present

## 2022-07-09 DIAGNOSIS — Z5111 Encounter for antineoplastic chemotherapy: Secondary | ICD-10-CM | POA: Diagnosis not present

## 2022-07-09 DIAGNOSIS — F0390 Unspecified dementia without behavioral disturbance: Secondary | ICD-10-CM | POA: Diagnosis not present

## 2022-07-09 DIAGNOSIS — M549 Dorsalgia, unspecified: Secondary | ICD-10-CM | POA: Diagnosis not present

## 2022-07-09 DIAGNOSIS — D696 Thrombocytopenia, unspecified: Secondary | ICD-10-CM | POA: Diagnosis not present

## 2022-07-09 DIAGNOSIS — D61810 Antineoplastic chemotherapy induced pancytopenia: Secondary | ICD-10-CM | POA: Diagnosis not present

## 2022-07-09 DIAGNOSIS — C9200 Acute myeloblastic leukemia, not having achieved remission: Secondary | ICD-10-CM | POA: Diagnosis not present

## 2022-07-09 DIAGNOSIS — C9201 Acute myeloblastic leukemia, in remission: Secondary | ICD-10-CM | POA: Diagnosis not present

## 2022-07-09 DIAGNOSIS — D638 Anemia in other chronic diseases classified elsewhere: Secondary | ICD-10-CM | POA: Diagnosis not present

## 2022-07-09 DIAGNOSIS — Z006 Encounter for examination for normal comparison and control in clinical research program: Secondary | ICD-10-CM | POA: Diagnosis not present

## 2022-07-09 DIAGNOSIS — F329 Major depressive disorder, single episode, unspecified: Secondary | ICD-10-CM | POA: Diagnosis not present

## 2022-07-09 DIAGNOSIS — T451X5A Adverse effect of antineoplastic and immunosuppressive drugs, initial encounter: Secondary | ICD-10-CM | POA: Diagnosis not present

## 2022-07-09 DIAGNOSIS — G8929 Other chronic pain: Secondary | ICD-10-CM | POA: Diagnosis not present

## 2022-07-09 DIAGNOSIS — I10 Essential (primary) hypertension: Secondary | ICD-10-CM | POA: Diagnosis not present

## 2022-07-09 DIAGNOSIS — D6959 Other secondary thrombocytopenia: Secondary | ICD-10-CM | POA: Diagnosis not present

## 2022-07-10 DIAGNOSIS — D696 Thrombocytopenia, unspecified: Secondary | ICD-10-CM | POA: Diagnosis not present

## 2022-07-10 DIAGNOSIS — C9200 Acute myeloblastic leukemia, not having achieved remission: Secondary | ICD-10-CM | POA: Diagnosis not present

## 2022-07-10 DIAGNOSIS — D638 Anemia in other chronic diseases classified elsewhere: Secondary | ICD-10-CM | POA: Diagnosis not present

## 2022-07-10 DIAGNOSIS — D61810 Antineoplastic chemotherapy induced pancytopenia: Secondary | ICD-10-CM | POA: Diagnosis not present

## 2022-07-10 DIAGNOSIS — F329 Major depressive disorder, single episode, unspecified: Secondary | ICD-10-CM | POA: Diagnosis not present

## 2022-07-10 DIAGNOSIS — F0390 Unspecified dementia without behavioral disturbance: Secondary | ICD-10-CM | POA: Diagnosis not present

## 2022-07-13 DIAGNOSIS — C9201 Acute myeloblastic leukemia, in remission: Secondary | ICD-10-CM | POA: Diagnosis not present

## 2022-07-13 DIAGNOSIS — Z79899 Other long term (current) drug therapy: Secondary | ICD-10-CM | POA: Diagnosis not present

## 2022-07-13 DIAGNOSIS — D61818 Other pancytopenia: Secondary | ICD-10-CM | POA: Diagnosis not present

## 2022-07-13 DIAGNOSIS — C9200 Acute myeloblastic leukemia, not having achieved remission: Secondary | ICD-10-CM | POA: Diagnosis not present

## 2022-07-14 DIAGNOSIS — R001 Bradycardia, unspecified: Secondary | ICD-10-CM | POA: Diagnosis not present

## 2022-07-16 DIAGNOSIS — C9201 Acute myeloblastic leukemia, in remission: Secondary | ICD-10-CM | POA: Diagnosis not present

## 2022-07-16 DIAGNOSIS — D61818 Other pancytopenia: Secondary | ICD-10-CM | POA: Diagnosis not present

## 2022-07-16 DIAGNOSIS — Z79899 Other long term (current) drug therapy: Secondary | ICD-10-CM | POA: Diagnosis not present

## 2022-07-16 DIAGNOSIS — C9200 Acute myeloblastic leukemia, not having achieved remission: Secondary | ICD-10-CM | POA: Diagnosis not present

## 2022-07-17 ENCOUNTER — Other Ambulatory Visit: Payer: Self-pay | Admitting: Neurology

## 2022-07-18 DIAGNOSIS — Z79899 Other long term (current) drug therapy: Secondary | ICD-10-CM | POA: Diagnosis not present

## 2022-07-18 DIAGNOSIS — D61818 Other pancytopenia: Secondary | ICD-10-CM | POA: Diagnosis not present

## 2022-07-18 DIAGNOSIS — C9200 Acute myeloblastic leukemia, not having achieved remission: Secondary | ICD-10-CM | POA: Diagnosis not present

## 2022-07-18 DIAGNOSIS — C9201 Acute myeloblastic leukemia, in remission: Secondary | ICD-10-CM | POA: Diagnosis not present

## 2022-07-20 DIAGNOSIS — D61818 Other pancytopenia: Secondary | ICD-10-CM | POA: Diagnosis not present

## 2022-07-20 DIAGNOSIS — Z79899 Other long term (current) drug therapy: Secondary | ICD-10-CM | POA: Diagnosis not present

## 2022-07-20 DIAGNOSIS — C9201 Acute myeloblastic leukemia, in remission: Secondary | ICD-10-CM | POA: Diagnosis not present

## 2022-07-20 DIAGNOSIS — C9200 Acute myeloblastic leukemia, not having achieved remission: Secondary | ICD-10-CM | POA: Diagnosis not present

## 2022-07-23 DIAGNOSIS — Z79899 Other long term (current) drug therapy: Secondary | ICD-10-CM | POA: Diagnosis not present

## 2022-07-23 DIAGNOSIS — C9200 Acute myeloblastic leukemia, not having achieved remission: Secondary | ICD-10-CM | POA: Diagnosis not present

## 2022-07-23 DIAGNOSIS — D61818 Other pancytopenia: Secondary | ICD-10-CM | POA: Diagnosis not present

## 2022-07-23 DIAGNOSIS — C9201 Acute myeloblastic leukemia, in remission: Secondary | ICD-10-CM | POA: Diagnosis not present

## 2022-07-25 DIAGNOSIS — C9200 Acute myeloblastic leukemia, not having achieved remission: Secondary | ICD-10-CM | POA: Diagnosis not present

## 2022-07-25 DIAGNOSIS — C9201 Acute myeloblastic leukemia, in remission: Secondary | ICD-10-CM | POA: Diagnosis not present

## 2022-07-25 DIAGNOSIS — Z79899 Other long term (current) drug therapy: Secondary | ICD-10-CM | POA: Diagnosis not present

## 2022-07-25 DIAGNOSIS — D61818 Other pancytopenia: Secondary | ICD-10-CM | POA: Diagnosis not present

## 2022-07-27 DIAGNOSIS — Z79899 Other long term (current) drug therapy: Secondary | ICD-10-CM | POA: Diagnosis not present

## 2022-07-27 DIAGNOSIS — C9201 Acute myeloblastic leukemia, in remission: Secondary | ICD-10-CM | POA: Diagnosis not present

## 2022-07-27 DIAGNOSIS — D61818 Other pancytopenia: Secondary | ICD-10-CM | POA: Diagnosis not present

## 2022-07-27 DIAGNOSIS — C9200 Acute myeloblastic leukemia, not having achieved remission: Secondary | ICD-10-CM | POA: Diagnosis not present

## 2022-07-30 DIAGNOSIS — Z79899 Other long term (current) drug therapy: Secondary | ICD-10-CM | POA: Diagnosis not present

## 2022-07-30 DIAGNOSIS — C9201 Acute myeloblastic leukemia, in remission: Secondary | ICD-10-CM | POA: Diagnosis not present

## 2022-07-30 DIAGNOSIS — C9200 Acute myeloblastic leukemia, not having achieved remission: Secondary | ICD-10-CM | POA: Diagnosis not present

## 2022-07-30 DIAGNOSIS — D61818 Other pancytopenia: Secondary | ICD-10-CM | POA: Diagnosis not present

## 2022-08-02 DIAGNOSIS — D61818 Other pancytopenia: Secondary | ICD-10-CM | POA: Diagnosis not present

## 2022-08-02 DIAGNOSIS — Z79899 Other long term (current) drug therapy: Secondary | ICD-10-CM | POA: Diagnosis not present

## 2022-08-02 DIAGNOSIS — C9200 Acute myeloblastic leukemia, not having achieved remission: Secondary | ICD-10-CM | POA: Diagnosis not present

## 2022-08-02 DIAGNOSIS — C9201 Acute myeloblastic leukemia, in remission: Secondary | ICD-10-CM | POA: Diagnosis not present

## 2022-08-03 DIAGNOSIS — G4733 Obstructive sleep apnea (adult) (pediatric): Secondary | ICD-10-CM | POA: Diagnosis not present

## 2022-08-06 DIAGNOSIS — Z452 Encounter for adjustment and management of vascular access device: Secondary | ICD-10-CM | POA: Diagnosis not present

## 2022-08-06 DIAGNOSIS — C9201 Acute myeloblastic leukemia, in remission: Secondary | ICD-10-CM | POA: Diagnosis not present

## 2022-08-06 DIAGNOSIS — C9200 Acute myeloblastic leukemia, not having achieved remission: Secondary | ICD-10-CM | POA: Diagnosis not present

## 2022-08-06 DIAGNOSIS — D61818 Other pancytopenia: Secondary | ICD-10-CM | POA: Diagnosis not present

## 2022-08-10 DIAGNOSIS — C9201 Acute myeloblastic leukemia, in remission: Secondary | ICD-10-CM | POA: Diagnosis not present

## 2022-08-10 DIAGNOSIS — Z452 Encounter for adjustment and management of vascular access device: Secondary | ICD-10-CM | POA: Diagnosis not present

## 2022-08-10 DIAGNOSIS — D61818 Other pancytopenia: Secondary | ICD-10-CM | POA: Diagnosis not present

## 2022-08-10 DIAGNOSIS — C9200 Acute myeloblastic leukemia, not having achieved remission: Secondary | ICD-10-CM | POA: Diagnosis not present

## 2022-08-13 DIAGNOSIS — D61818 Other pancytopenia: Secondary | ICD-10-CM | POA: Diagnosis not present

## 2022-08-13 DIAGNOSIS — C9200 Acute myeloblastic leukemia, not having achieved remission: Secondary | ICD-10-CM | POA: Diagnosis not present

## 2022-08-13 DIAGNOSIS — Z452 Encounter for adjustment and management of vascular access device: Secondary | ICD-10-CM | POA: Diagnosis not present

## 2022-08-13 DIAGNOSIS — C9201 Acute myeloblastic leukemia, in remission: Secondary | ICD-10-CM | POA: Diagnosis not present

## 2022-08-16 DIAGNOSIS — C9200 Acute myeloblastic leukemia, not having achieved remission: Secondary | ICD-10-CM | POA: Diagnosis not present

## 2022-08-16 DIAGNOSIS — Z452 Encounter for adjustment and management of vascular access device: Secondary | ICD-10-CM | POA: Diagnosis not present

## 2022-08-16 DIAGNOSIS — C9201 Acute myeloblastic leukemia, in remission: Secondary | ICD-10-CM | POA: Diagnosis not present

## 2022-08-16 DIAGNOSIS — D61818 Other pancytopenia: Secondary | ICD-10-CM | POA: Diagnosis not present

## 2022-08-20 DIAGNOSIS — C9200 Acute myeloblastic leukemia, not having achieved remission: Secondary | ICD-10-CM | POA: Diagnosis not present

## 2022-08-20 DIAGNOSIS — Z452 Encounter for adjustment and management of vascular access device: Secondary | ICD-10-CM | POA: Diagnosis not present

## 2022-08-20 DIAGNOSIS — C9201 Acute myeloblastic leukemia, in remission: Secondary | ICD-10-CM | POA: Diagnosis not present

## 2022-08-20 DIAGNOSIS — D61818 Other pancytopenia: Secondary | ICD-10-CM | POA: Diagnosis not present

## 2022-08-27 DIAGNOSIS — I10 Essential (primary) hypertension: Secondary | ICD-10-CM | POA: Diagnosis not present

## 2022-08-27 DIAGNOSIS — R509 Fever, unspecified: Secondary | ICD-10-CM | POA: Diagnosis not present

## 2022-08-27 DIAGNOSIS — R197 Diarrhea, unspecified: Secondary | ICD-10-CM | POA: Diagnosis not present

## 2022-08-27 DIAGNOSIS — Z006 Encounter for examination for normal comparison and control in clinical research program: Secondary | ICD-10-CM | POA: Diagnosis not present

## 2022-08-27 DIAGNOSIS — R300 Dysuria: Secondary | ICD-10-CM | POA: Diagnosis not present

## 2022-08-27 DIAGNOSIS — R5381 Other malaise: Secondary | ICD-10-CM | POA: Diagnosis not present

## 2022-08-27 DIAGNOSIS — G250 Essential tremor: Secondary | ICD-10-CM | POA: Diagnosis not present

## 2022-08-27 DIAGNOSIS — T451X5A Adverse effect of antineoplastic and immunosuppressive drugs, initial encounter: Secondary | ICD-10-CM | POA: Diagnosis not present

## 2022-08-27 DIAGNOSIS — I491 Atrial premature depolarization: Secondary | ICD-10-CM | POA: Diagnosis not present

## 2022-08-27 DIAGNOSIS — R502 Drug induced fever: Secondary | ICD-10-CM | POA: Diagnosis not present

## 2022-08-27 DIAGNOSIS — Z043 Encounter for examination and observation following other accident: Secondary | ICD-10-CM | POA: Diagnosis not present

## 2022-08-27 DIAGNOSIS — J811 Chronic pulmonary edema: Secondary | ICD-10-CM | POA: Diagnosis not present

## 2022-08-27 DIAGNOSIS — G4733 Obstructive sleep apnea (adult) (pediatric): Secondary | ICD-10-CM | POA: Diagnosis not present

## 2022-08-27 DIAGNOSIS — Z79899 Other long term (current) drug therapy: Secondary | ICD-10-CM | POA: Diagnosis not present

## 2022-08-27 DIAGNOSIS — I272 Pulmonary hypertension, unspecified: Secondary | ICD-10-CM | POA: Diagnosis not present

## 2022-08-27 DIAGNOSIS — D61810 Antineoplastic chemotherapy induced pancytopenia: Secondary | ICD-10-CM | POA: Diagnosis not present

## 2022-08-27 DIAGNOSIS — C9201 Acute myeloblastic leukemia, in remission: Secondary | ICD-10-CM | POA: Diagnosis not present

## 2022-08-27 DIAGNOSIS — Y92230 Patient room in hospital as the place of occurrence of the external cause: Secondary | ICD-10-CM | POA: Diagnosis not present

## 2022-08-27 DIAGNOSIS — K5900 Constipation, unspecified: Secondary | ICD-10-CM | POA: Diagnosis not present

## 2022-08-27 DIAGNOSIS — R4182 Altered mental status, unspecified: Secondary | ICD-10-CM | POA: Diagnosis not present

## 2022-08-27 DIAGNOSIS — S0031XA Abrasion of nose, initial encounter: Secondary | ICD-10-CM | POA: Diagnosis not present

## 2022-08-27 DIAGNOSIS — I951 Orthostatic hypotension: Secondary | ICD-10-CM | POA: Diagnosis not present

## 2022-08-27 DIAGNOSIS — F32A Depression, unspecified: Secondary | ICD-10-CM | POA: Diagnosis not present

## 2022-08-27 DIAGNOSIS — E559 Vitamin D deficiency, unspecified: Secondary | ICD-10-CM | POA: Diagnosis not present

## 2022-08-27 DIAGNOSIS — Z5111 Encounter for antineoplastic chemotherapy: Secondary | ICD-10-CM | POA: Diagnosis not present

## 2022-08-27 DIAGNOSIS — M79662 Pain in left lower leg: Secondary | ICD-10-CM | POA: Diagnosis not present

## 2022-08-27 DIAGNOSIS — W1830XA Fall on same level, unspecified, initial encounter: Secondary | ICD-10-CM | POA: Diagnosis not present

## 2022-08-28 DIAGNOSIS — I491 Atrial premature depolarization: Secondary | ICD-10-CM | POA: Diagnosis not present

## 2022-08-28 DIAGNOSIS — C9201 Acute myeloblastic leukemia, in remission: Secondary | ICD-10-CM | POA: Diagnosis not present

## 2022-08-28 DIAGNOSIS — E559 Vitamin D deficiency, unspecified: Secondary | ICD-10-CM | POA: Diagnosis not present

## 2022-08-28 DIAGNOSIS — D61810 Antineoplastic chemotherapy induced pancytopenia: Secondary | ICD-10-CM | POA: Diagnosis not present

## 2022-08-28 DIAGNOSIS — R5381 Other malaise: Secondary | ICD-10-CM | POA: Diagnosis not present

## 2022-08-28 DIAGNOSIS — G4733 Obstructive sleep apnea (adult) (pediatric): Secondary | ICD-10-CM | POA: Diagnosis not present

## 2022-08-29 DIAGNOSIS — E559 Vitamin D deficiency, unspecified: Secondary | ICD-10-CM | POA: Diagnosis not present

## 2022-08-29 DIAGNOSIS — G4733 Obstructive sleep apnea (adult) (pediatric): Secondary | ICD-10-CM | POA: Diagnosis not present

## 2022-08-29 DIAGNOSIS — R509 Fever, unspecified: Secondary | ICD-10-CM | POA: Diagnosis not present

## 2022-08-29 DIAGNOSIS — D61810 Antineoplastic chemotherapy induced pancytopenia: Secondary | ICD-10-CM | POA: Diagnosis not present

## 2022-08-29 DIAGNOSIS — R5381 Other malaise: Secondary | ICD-10-CM | POA: Diagnosis not present

## 2022-08-29 DIAGNOSIS — C9201 Acute myeloblastic leukemia, in remission: Secondary | ICD-10-CM | POA: Diagnosis not present

## 2022-08-30 DIAGNOSIS — M79662 Pain in left lower leg: Secondary | ICD-10-CM | POA: Diagnosis not present

## 2022-09-03 DIAGNOSIS — C9201 Acute myeloblastic leukemia, in remission: Secondary | ICD-10-CM | POA: Diagnosis not present

## 2022-09-03 DIAGNOSIS — C9200 Acute myeloblastic leukemia, not having achieved remission: Secondary | ICD-10-CM | POA: Diagnosis not present

## 2022-09-03 DIAGNOSIS — Z452 Encounter for adjustment and management of vascular access device: Secondary | ICD-10-CM | POA: Diagnosis not present

## 2022-09-03 DIAGNOSIS — D61818 Other pancytopenia: Secondary | ICD-10-CM | POA: Diagnosis not present

## 2022-09-05 DIAGNOSIS — Z452 Encounter for adjustment and management of vascular access device: Secondary | ICD-10-CM | POA: Diagnosis not present

## 2022-09-05 DIAGNOSIS — D61818 Other pancytopenia: Secondary | ICD-10-CM | POA: Diagnosis not present

## 2022-09-05 DIAGNOSIS — C9201 Acute myeloblastic leukemia, in remission: Secondary | ICD-10-CM | POA: Diagnosis not present

## 2022-09-05 DIAGNOSIS — C9200 Acute myeloblastic leukemia, not having achieved remission: Secondary | ICD-10-CM | POA: Diagnosis not present

## 2022-09-07 DIAGNOSIS — C9201 Acute myeloblastic leukemia, in remission: Secondary | ICD-10-CM | POA: Diagnosis not present

## 2022-09-07 DIAGNOSIS — D61818 Other pancytopenia: Secondary | ICD-10-CM | POA: Diagnosis not present

## 2022-09-10 DIAGNOSIS — D61818 Other pancytopenia: Secondary | ICD-10-CM | POA: Diagnosis not present

## 2022-09-10 DIAGNOSIS — C9201 Acute myeloblastic leukemia, in remission: Secondary | ICD-10-CM | POA: Diagnosis not present

## 2022-09-12 DIAGNOSIS — C9201 Acute myeloblastic leukemia, in remission: Secondary | ICD-10-CM | POA: Diagnosis not present

## 2022-09-12 DIAGNOSIS — D61818 Other pancytopenia: Secondary | ICD-10-CM | POA: Diagnosis not present

## 2022-09-14 DIAGNOSIS — D61818 Other pancytopenia: Secondary | ICD-10-CM | POA: Diagnosis not present

## 2022-09-14 DIAGNOSIS — C9201 Acute myeloblastic leukemia, in remission: Secondary | ICD-10-CM | POA: Diagnosis not present

## 2022-09-17 DIAGNOSIS — C9201 Acute myeloblastic leukemia, in remission: Secondary | ICD-10-CM | POA: Diagnosis not present

## 2022-09-17 DIAGNOSIS — D61818 Other pancytopenia: Secondary | ICD-10-CM | POA: Diagnosis not present

## 2022-09-19 DIAGNOSIS — D61818 Other pancytopenia: Secondary | ICD-10-CM | POA: Diagnosis not present

## 2022-09-19 DIAGNOSIS — C9201 Acute myeloblastic leukemia, in remission: Secondary | ICD-10-CM | POA: Diagnosis not present

## 2022-09-21 DIAGNOSIS — C9201 Acute myeloblastic leukemia, in remission: Secondary | ICD-10-CM | POA: Diagnosis not present

## 2022-09-21 DIAGNOSIS — D61818 Other pancytopenia: Secondary | ICD-10-CM | POA: Diagnosis not present

## 2022-09-24 DIAGNOSIS — C9201 Acute myeloblastic leukemia, in remission: Secondary | ICD-10-CM | POA: Diagnosis not present

## 2022-09-24 DIAGNOSIS — D61818 Other pancytopenia: Secondary | ICD-10-CM | POA: Diagnosis not present

## 2022-09-27 DIAGNOSIS — C9201 Acute myeloblastic leukemia, in remission: Secondary | ICD-10-CM | POA: Diagnosis not present

## 2022-09-27 DIAGNOSIS — D61818 Other pancytopenia: Secondary | ICD-10-CM | POA: Diagnosis not present

## 2022-10-01 DIAGNOSIS — C9201 Acute myeloblastic leukemia, in remission: Secondary | ICD-10-CM | POA: Diagnosis not present

## 2022-10-01 DIAGNOSIS — D61818 Other pancytopenia: Secondary | ICD-10-CM | POA: Diagnosis not present

## 2022-10-15 DIAGNOSIS — R197 Diarrhea, unspecified: Secondary | ICD-10-CM | POA: Diagnosis not present

## 2022-10-15 DIAGNOSIS — J811 Chronic pulmonary edema: Secondary | ICD-10-CM | POA: Diagnosis not present

## 2022-10-15 DIAGNOSIS — R509 Fever, unspecified: Secondary | ICD-10-CM | POA: Diagnosis not present

## 2022-10-15 DIAGNOSIS — E8770 Fluid overload, unspecified: Secondary | ICD-10-CM | POA: Diagnosis not present

## 2022-10-15 DIAGNOSIS — C9201 Acute myeloblastic leukemia, in remission: Secondary | ICD-10-CM | POA: Diagnosis not present

## 2022-11-26 DIAGNOSIS — C9201 Acute myeloblastic leukemia, in remission: Secondary | ICD-10-CM | POA: Diagnosis not present

## 2023-01-07 DIAGNOSIS — G3184 Mild cognitive impairment, so stated: Secondary | ICD-10-CM | POA: Diagnosis not present

## 2023-01-07 DIAGNOSIS — R197 Diarrhea, unspecified: Secondary | ICD-10-CM | POA: Diagnosis not present

## 2023-01-07 DIAGNOSIS — K5900 Constipation, unspecified: Secondary | ICD-10-CM | POA: Diagnosis not present

## 2023-01-07 DIAGNOSIS — C9201 Acute myeloblastic leukemia, in remission: Secondary | ICD-10-CM | POA: Diagnosis not present

## 2023-02-15 DIAGNOSIS — E291 Testicular hypofunction: Secondary | ICD-10-CM | POA: Diagnosis not present

## 2023-02-21 DIAGNOSIS — E291 Testicular hypofunction: Secondary | ICD-10-CM | POA: Diagnosis not present

## 2023-02-22 DIAGNOSIS — M654 Radial styloid tenosynovitis [de Quervain]: Secondary | ICD-10-CM | POA: Diagnosis not present

## 2023-02-22 DIAGNOSIS — M79644 Pain in right finger(s): Secondary | ICD-10-CM | POA: Diagnosis not present

## 2023-04-08 DIAGNOSIS — C9201 Acute myeloblastic leukemia, in remission: Secondary | ICD-10-CM | POA: Diagnosis not present

## 2023-04-08 DIAGNOSIS — R197 Diarrhea, unspecified: Secondary | ICD-10-CM | POA: Diagnosis not present

## 2023-04-08 DIAGNOSIS — G3184 Mild cognitive impairment, so stated: Secondary | ICD-10-CM | POA: Diagnosis not present

## 2023-04-08 DIAGNOSIS — K5900 Constipation, unspecified: Secondary | ICD-10-CM | POA: Diagnosis not present

## 2023-05-13 DIAGNOSIS — M25561 Pain in right knee: Secondary | ICD-10-CM | POA: Diagnosis not present

## 2023-07-08 DIAGNOSIS — Z79899 Other long term (current) drug therapy: Secondary | ICD-10-CM | POA: Diagnosis not present

## 2023-07-08 DIAGNOSIS — C9201 Acute myeloblastic leukemia, in remission: Secondary | ICD-10-CM | POA: Diagnosis not present

## 2023-09-27 ENCOUNTER — Encounter: Payer: Self-pay | Admitting: Radiology

## 2023-10-14 DIAGNOSIS — Z79899 Other long term (current) drug therapy: Secondary | ICD-10-CM | POA: Diagnosis not present

## 2023-10-14 DIAGNOSIS — C9201 Acute myeloblastic leukemia, in remission: Secondary | ICD-10-CM | POA: Diagnosis not present

## 2023-12-09 ENCOUNTER — Encounter: Payer: Self-pay | Admitting: Radiology

## 2024-01-13 DIAGNOSIS — C9201 Acute myeloblastic leukemia, in remission: Secondary | ICD-10-CM | POA: Diagnosis not present
